# Patient Record
Sex: Female | Born: 1945 | Race: White | Hispanic: No | Smoking: Former smoker
Health system: Southern US, Community
[De-identification: ages and names within clinical notes are randomized; demographics above are authoritative.]

## PROBLEM LIST (undated history)

## (undated) DIAGNOSIS — K08109 Complete loss of teeth, unspecified cause, unspecified class: Secondary | ICD-10-CM

## (undated) DIAGNOSIS — Z91410 Personal history of adult physical and sexual abuse: Secondary | ICD-10-CM

## (undated) DIAGNOSIS — K219 Gastro-esophageal reflux disease without esophagitis: Secondary | ICD-10-CM

## (undated) DIAGNOSIS — I10 Essential (primary) hypertension: Secondary | ICD-10-CM

## (undated) DIAGNOSIS — M25551 Pain in right hip: Secondary | ICD-10-CM

## (undated) DIAGNOSIS — M1990 Unspecified osteoarthritis, unspecified site: Secondary | ICD-10-CM

## (undated) DIAGNOSIS — Z972 Presence of dental prosthetic device (complete) (partial): Secondary | ICD-10-CM

## (undated) DIAGNOSIS — I739 Peripheral vascular disease, unspecified: Secondary | ICD-10-CM

## (undated) DIAGNOSIS — K589 Irritable bowel syndrome without diarrhea: Secondary | ICD-10-CM

## (undated) DIAGNOSIS — E119 Type 2 diabetes mellitus without complications: Secondary | ICD-10-CM

## (undated) DIAGNOSIS — G7000 Myasthenia gravis without (acute) exacerbation: Secondary | ICD-10-CM

## (undated) DIAGNOSIS — G8929 Other chronic pain: Secondary | ICD-10-CM

## (undated) DIAGNOSIS — M797 Fibromyalgia: Secondary | ICD-10-CM

## (undated) DIAGNOSIS — G47419 Narcolepsy without cataplexy: Secondary | ICD-10-CM

## (undated) DIAGNOSIS — Z9889 Other specified postprocedural states: Secondary | ICD-10-CM

## (undated) DIAGNOSIS — M545 Low back pain, unspecified: Secondary | ICD-10-CM

## (undated) DIAGNOSIS — N1832 Chronic kidney disease, stage 3b: Secondary | ICD-10-CM

## (undated) DIAGNOSIS — J45909 Unspecified asthma, uncomplicated: Secondary | ICD-10-CM

## (undated) HISTORY — PX: PARTIAL HYSTERECTOMY: SHX80

## (undated) HISTORY — DX: Presence of dental prosthetic device (complete) (partial): K08.109

## (undated) HISTORY — PX: CATARACT EXTRACTION, BILATERAL: SHX1313

---

## 2017-05-31 ENCOUNTER — Other Ambulatory Visit: Payer: Self-pay | Admitting: Internal Medicine

## 2017-05-31 DIAGNOSIS — Z1231 Encounter for screening mammogram for malignant neoplasm of breast: Secondary | ICD-10-CM

## 2017-05-31 DIAGNOSIS — N958 Other specified menopausal and perimenopausal disorders: Secondary | ICD-10-CM

## 2017-06-05 ENCOUNTER — Other Ambulatory Visit: Payer: Self-pay

## 2017-06-05 ENCOUNTER — Telehealth: Payer: Self-pay

## 2017-06-05 DIAGNOSIS — Z1211 Encounter for screening for malignant neoplasm of colon: Secondary | ICD-10-CM

## 2017-06-05 NOTE — Telephone Encounter (Signed)
Gastroenterology Pre-Procedure Review  Request Date: 07/03/17 Requesting Physician: Dr. Allen Norris  PATIENT REVIEW QUESTIONS: The patient responded to the following health history questions as indicated:    1. Are you having any GI issues? no 2. Do you have a personal history of Polyps? yes (10 years ago) 3. Do you have a family history of Colon Cancer or Polyps? no 4. Diabetes Mellitus? yes (yes type 2) 5. Joint replacements in the past 12 months?no 6. Major health problems in the past 3 months?no 7. Any artificial heart valves, MVP, or defibrillator?no    MEDICATIONS & ALLERGIES:    Patient reports the following regarding taking any anticoagulation/antiplatelet therapy:   Plavix, Coumadin, Eliquis, Xarelto, Lovenox, Pradaxa, Brilinta, or Effient? no Aspirin? yes (81 mg daily)  Patient confirms/reports the following medications:  No current outpatient medications on file.   No current facility-administered medications for this visit.     Patient confirms/reports the following allergies:  Allergies not on file  No orders of the defined types were placed in this encounter.   AUTHORIZATION INFORMATION Primary Insurance: 1D#: Group #:  Secondary Insurance: 1D#: Group #:  SCHEDULE INFORMATION: Date: 07/03/17 Time: Location:[facility]

## 2017-06-28 ENCOUNTER — Encounter: Payer: Self-pay | Admitting: *Deleted

## 2017-06-28 ENCOUNTER — Other Ambulatory Visit: Payer: Self-pay

## 2017-06-29 NOTE — Discharge Instructions (Signed)
General Anesthesia, Adult, Care After °These instructions provide you with information about caring for yourself after your procedure. Your health care provider may also give you more specific instructions. Your treatment has been planned according to current medical practices, but problems sometimes occur. Call your health care provider if you have any problems or questions after your procedure. °What can I expect after the procedure? °After the procedure, it is common to have: °· Vomiting. °· A sore throat. °· Mental slowness. ° °It is common to feel: °· Nauseous. °· Cold or shivery. °· Sleepy. °· Tired. °· Sore or achy, even in parts of your body where you did not have surgery. ° °Follow these instructions at home: °For at least 24 hours after the procedure: °· Do not: °? Participate in activities where you could fall or become injured. °? Drive. °? Use heavy machinery. °? Drink alcohol. °? Take sleeping pills or medicines that cause drowsiness. °? Make important decisions or sign legal documents. °? Take care of children on your own. °· Rest. °Eating and drinking °· If you vomit, drink water, juice, or soup when you can drink without vomiting. °· Drink enough fluid to keep your urine clear or pale yellow. °· Make sure you have little or no nausea before eating solid foods. °· Follow the diet recommended by your health care provider. °General instructions °· Have a responsible adult stay with you until you are awake and alert. °· Return to your normal activities as told by your health care provider. Ask your health care provider what activities are safe for you. °· Take over-the-counter and prescription medicines only as told by your health care provider. °· If you smoke, do not smoke without supervision. °· Keep all follow-up visits as told by your health care provider. This is important. °Contact a health care provider if: °· You continue to have nausea or vomiting at home, and medicines are not helpful. °· You  cannot drink fluids or start eating again. °· You cannot urinate after 8-12 hours. °· You develop a skin rash. °· You have fever. °· You have increasing redness at the site of your procedure. °Get help right away if: °· You have difficulty breathing. °· You have chest pain. °· You have unexpected bleeding. °· You feel that you are having a life-threatening or urgent problem. °This information is not intended to replace advice given to you by your health care provider. Make sure you discuss any questions you have with your health care provider. °Document Released: 08/08/2000 Document Revised: 10/05/2015 Document Reviewed: 04/16/2015 °Elsevier Interactive Patient Education © 2018 [employer] ° °

## 2017-07-03 ENCOUNTER — Encounter
Admission: RE | Disposition: A | Discharge status: Home / Self Care | Payer: Self-pay | Source: Ambulatory Visit | Attending: Gastroenterology

## 2017-07-03 ENCOUNTER — Ambulatory Visit
Admission: RE | Admit: 2017-07-03 | Discharge: 2017-07-03 | Disposition: A | Discharge status: Home / Self Care | Payer: Medicare Other | Source: Ambulatory Visit | Attending: Gastroenterology | Admitting: Gastroenterology

## 2017-07-03 ENCOUNTER — Ambulatory Visit: Payer: Medicare Other | Admitting: Anesthesiology

## 2017-07-03 DIAGNOSIS — Z79899 Other long term (current) drug therapy: Secondary | ICD-10-CM | POA: Insufficient documentation

## 2017-07-03 DIAGNOSIS — Z87891 Personal history of nicotine dependence: Secondary | ICD-10-CM | POA: Diagnosis not present

## 2017-07-03 DIAGNOSIS — Z7982 Long term (current) use of aspirin: Secondary | ICD-10-CM | POA: Diagnosis not present

## 2017-07-03 DIAGNOSIS — K648 Other hemorrhoids: Secondary | ICD-10-CM | POA: Insufficient documentation

## 2017-07-03 DIAGNOSIS — I10 Essential (primary) hypertension: Secondary | ICD-10-CM | POA: Insufficient documentation

## 2017-07-03 DIAGNOSIS — M797 Fibromyalgia: Secondary | ICD-10-CM | POA: Insufficient documentation

## 2017-07-03 DIAGNOSIS — K219 Gastro-esophageal reflux disease without esophagitis: Secondary | ICD-10-CM | POA: Insufficient documentation

## 2017-07-03 DIAGNOSIS — D125 Benign neoplasm of sigmoid colon: Secondary | ICD-10-CM | POA: Diagnosis not present

## 2017-07-03 DIAGNOSIS — K635 Polyp of colon: Secondary | ICD-10-CM

## 2017-07-03 DIAGNOSIS — Z1211 Encounter for screening for malignant neoplasm of colon: Secondary | ICD-10-CM | POA: Diagnosis not present

## 2017-07-03 DIAGNOSIS — E119 Type 2 diabetes mellitus without complications: Secondary | ICD-10-CM | POA: Diagnosis not present

## 2017-07-03 DIAGNOSIS — Z7984 Long term (current) use of oral hypoglycemic drugs: Secondary | ICD-10-CM | POA: Insufficient documentation

## 2017-07-03 HISTORY — DX: Other specified postprocedural states: Z98.890

## 2017-07-03 HISTORY — PX: POLYPECTOMY: SHX5525

## 2017-07-03 HISTORY — DX: Presence of dental prosthetic device (complete) (partial): Z97.2

## 2017-07-03 HISTORY — DX: Type 2 diabetes mellitus without complications: E11.9

## 2017-07-03 HISTORY — DX: Gastro-esophageal reflux disease without esophagitis: K21.9

## 2017-07-03 HISTORY — DX: Personal history of adult physical and sexual abuse: Z91.410

## 2017-07-03 HISTORY — DX: Low back pain, unspecified: M54.50

## 2017-07-03 HISTORY — PX: COLONOSCOPY WITH PROPOFOL: SHX5780

## 2017-07-03 HISTORY — DX: Unspecified osteoarthritis, unspecified site: M19.90

## 2017-07-03 HISTORY — DX: Essential (primary) hypertension: I10

## 2017-07-03 HISTORY — DX: Pain in right hip: M25.551

## 2017-07-03 HISTORY — DX: Low back pain: M54.5

## 2017-07-03 HISTORY — DX: Other chronic pain: G89.29

## 2017-07-03 HISTORY — DX: Fibromyalgia: M79.7

## 2017-07-03 LAB — GLUCOSE, CAPILLARY
Glucose-Capillary: 135 mg/dL — ABNORMAL HIGH (ref 65–99)
Glucose-Capillary: 125 mg/dL — ABNORMAL HIGH (ref 65–99)

## 2017-07-03 SURGERY — COLONOSCOPY WITH PROPOFOL
Anesthesia: General | Site: Rectum | Wound class: Contaminated

## 2017-07-03 MED ORDER — LIDOCAINE HCL (CARDIAC) 20 MG/ML IV SOLN
INTRAVENOUS | Status: DC | PRN
  Administered 2017-07-03: 40 mg via INTRAVENOUS

## 2017-07-03 MED ORDER — ACETAMINOPHEN 160 MG/5ML PO SOLN: 325.0000 mg | ORAL | Status: DC | PRN

## 2017-07-03 MED ORDER — STERILE WATER FOR IRRIGATION IR SOLN
Status: DC | PRN
  Administered 2017-07-03: .5 mL

## 2017-07-03 MED ORDER — ONDANSETRON HCL 4 MG/2ML IJ SOLN: 4.0000 mg | Freq: Once | INTRAMUSCULAR | Status: DC | PRN

## 2017-07-03 MED ORDER — LACTATED RINGERS IV SOLN
INTRAVENOUS | Status: DC
  Administered 2017-07-03: 07:00:00 via INTRAVENOUS

## 2017-07-03 MED ORDER — PROPOFOL 10 MG/ML IV BOLUS
INTRAVENOUS | Status: DC | PRN
  Administered 2017-07-03: 30 mg via INTRAVENOUS
  Administered 2017-07-03 (×2): 20 mg via INTRAVENOUS
  Administered 2017-07-03: 30 mg via INTRAVENOUS
  Administered 2017-07-03: 100 mg via INTRAVENOUS
  Administered 2017-07-03: 30 mg via INTRAVENOUS
  Administered 2017-07-03: 20 mg via INTRAVENOUS

## 2017-07-03 MED ORDER — ACETAMINOPHEN 325 MG PO TABS: 650.0000 mg | ORAL_TABLET | Freq: Once | ORAL | Status: DC | PRN

## 2017-07-03 SURGICAL SUPPLY — 6 items
CANISTER SUCT 1200ML W/VALVE (MISCELLANEOUS) ×4 IMPLANT
FORCEPS BIOP RAD 4 LRG CAP 4 (CUTTING FORCEPS) ×4 IMPLANT
GOWN CVR UNV OPN BCK APRN NK (MISCELLANEOUS) ×4 IMPLANT
GOWN ISOL THUMB LOOP REG UNIV (MISCELLANEOUS) ×4
KIT ENDO PROCEDURE OLY (KITS) ×4 IMPLANT
WATER STERILE IRR 250ML POUR (IV SOLUTION) ×4 IMPLANT

## 2017-07-03 NOTE — Anesthesia Preprocedure Evaluation (Signed)
Anesthesia Evaluation  Patient identified by MRN, date of birth, ID band Patient awake    Reviewed: Allergy & Precautions, NPO status , Patient's Chart, lab work & pertinent test results  History of Anesthesia Complications Negative for: history of anesthetic complications  Airway Mallampati: III  TM Distance: >3 FB Neck ROM: Full    Dental  (+) Edentulous Upper, Edentulous Lower, Upper Dentures, Lower Dentures   Pulmonary former smoker (quit 2006),  Snoring    Pulmonary exam normal breath sounds clear to auscultation       Cardiovascular Exercise Tolerance: Good hypertension,  Rhythm:Regular Rate:Normal + Systolic murmurs    Neuro/Psych Fibromyalgia     GI/Hepatic GERD  ,  Endo/Other  diabetes  Renal/GU negative Renal ROS     Musculoskeletal  (+) Fibromyalgia -  Abdominal   Peds  Hematology negative hematology ROS (+)   Anesthesia Other Findings   Reproductive/Obstetrics                             Anesthesia Physical Anesthesia Plan  ASA: III  Anesthesia Plan: General   Post-op Pain Management:    Induction: Intravenous  PONV Risk Score and Plan: 2 and Propofol infusion and TIVA  Airway Management Planned: Natural Airway  Additional Equipment:   Intra-op Plan:   Post-operative Plan:   Informed Consent: I have reviewed the patients History and Physical, chart, labs and discussed the procedure including the risks, benefits and alternatives for the proposed anesthesia with the patient or authorized representative who has indicated his/her understanding and acceptance.     Plan Discussed with: CRNA  Anesthesia Plan Comments:         Anesthesia Quick Evaluation
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
## 2017-07-03 NOTE — Anesthesia Procedure Notes (Signed)
Procedure Name: MAC Date/Time: 07/03/2017 7:51 AM Performed by: Janna Arch, CRNA Pre-anesthesia Checklist: Patient identified, Emergency Drugs available, Suction available and Patient being monitored Patient Re-evaluated:Patient Re-evaluated prior to induction Oxygen Delivery Method: Nasal cannula

## 2017-07-03 NOTE — Transfer of Care (Signed)
Immediate Anesthesia Transfer of Care Note  Patient: Jennifer Shelton  Procedure(s) Performed: COLONOSCOPY WITH PROPOFOL (N/A Rectum) POLYPECTOMY (Rectum)  Patient Location: PACU  Anesthesia Type: General  Level of Consciousness: awake, alert  and patient cooperative  Airway and Oxygen Therapy: Patient Spontanous Breathing and Patient connected to supplemental oxygen  Post-op Assessment: Post-op Vital signs reviewed, Patient's Cardiovascular Status Stable, Respiratory Function Stable, Patent Airway and No signs of Nausea or vomiting  Post-op Vital Signs: Reviewed and stable  Complications: No apparent anesthesia complications

## 2017-07-03 NOTE — Op Note (Signed)
[facility] Gastroenterology Patient Name: Jennifer Shelton Procedure Date: 07/03/2017 7:37 AM MRN: [id number] Account #: [id number] Date of Birth: 1945/10/06 Admit Type: Outpatient Age: 72 Room: [facility] OR ROOM 01 Gender: Female Note Status: Finalized Procedure:            Colonoscopy Indications:          Screening for colorectal malignant neoplasm Providers:            [name] MD, MD Medicines:            Propofol per Anesthesia Complications:        No immediate complications. Procedure:            Pre-Anesthesia Assessment:                       - Prior to the procedure, a History and Physical was                        performed, and patient medications and allergies were                        reviewed. The patient's tolerance of previous                        anesthesia was also reviewed. The risks and benefits of                        the procedure and the sedation options and risks were                        discussed with the patient. All questions were                        answered, and informed consent was obtained. Prior                        Anticoagulants: The patient has taken no previous                        anticoagulant or antiplatelet agents. ASA Grade                        Assessment: II - A patient with mild systemic disease.                        After reviewing the risks and benefits, the patient was                        deemed in satisfactory condition to undergo the                        procedure.                       After obtaining informed consent, the colonoscope was                        passed under direct vision. Throughout the procedure,                        the patient's blood pressure,  pulse, and oxygen                        saturations were monitored continuously. The Olympus                        CF-HQ190L Colonoscope (S#. [id number]) was introduced                        through the anus and advanced to the  the cecum,                        identified by appendiceal orifice and ileocecal valve.                        The colonoscopy was performed without difficulty. The                        patient tolerated the procedure well. The quality of                        the bowel preparation was good. Findings:      The perianal and digital rectal examinations were normal.      Two sessile polyps were found in the sigmoid colon. The polyps were 2 to       3 mm in size. These polyps were removed with a cold biopsy forceps.       Resection and retrieval were complete.      Non-bleeding internal hemorrhoids were found during retroflexion. The       hemorrhoids were Grade I (internal hemorrhoids that do not prolapse). Impression:           - Two 2 to 3 mm polyps in the sigmoid colon, removed                        with a cold biopsy forceps. Resected and retrieved.                       - Non-bleeding internal hemorrhoids. Recommendation:       - Discharge patient to home.                       - Resume previous diet.                       - Continue present medications.                       - Await pathology results.                       - Repeat colonoscopy in 5 years if polyp adenoma and 10                        years if hyperplastic Procedure Code(s):    --- Professional ---                       [phone], Colonoscopy, flexible; with biopsy, single or                        multiple Diagnosis Code(s):    ---  Professional ---                       Z12.11, Encounter for screening for malignant neoplasm                        of colon                       D12.5, Benign neoplasm of sigmoid colon CPT copyright 2016 American Medical Association. All rights reserved. The codes documented in this report are preliminary and upon coder review may  be revised to meet current compliance requirements. [name] MD, MD 07/03/2017 8:09:09 AM This report has been signed electronically. Number of  Addenda: 0 Note Initiated On: 07/03/2017 7:37 AM Scope Withdrawal Time: 0 hours 7 minutes 17 seconds  Total Procedure Duration: 0 hours 10 minutes 54 seconds       [facility]

## 2017-07-03 NOTE — Anesthesia Postprocedure Evaluation (Signed)
Anesthesia Post Note  Patient: Jennifer Shelton  Procedure(s) Performed: COLONOSCOPY WITH PROPOFOL (N/A Rectum) POLYPECTOMY (Rectum)  Patient location during evaluation: PACU Anesthesia Type: General Level of consciousness: awake and alert, oriented and patient cooperative Pain management: pain level controlled Vital Signs Assessment: post-procedure vital signs reviewed and stable Respiratory status: spontaneous breathing, nonlabored ventilation and respiratory function stable Cardiovascular status: blood pressure returned to baseline and stable Postop Assessment: adequate PO intake Anesthetic complications: no    Darrin Nipper

## 2017-07-10 ENCOUNTER — Encounter: Payer: Self-pay | Admitting: Gastroenterology

## 2018-01-22 ENCOUNTER — Ambulatory Visit: Payer: Medicare Other | Admitting: Gastroenterology

## 2018-03-07 ENCOUNTER — Ambulatory Visit: Payer: Medicare Other | Admitting: Gastroenterology

## 2018-04-11 ENCOUNTER — Ambulatory Visit: Payer: Medicare Other | Admitting: Gastroenterology

## 2018-05-15 ENCOUNTER — Encounter: Payer: Self-pay | Admitting: *Deleted

## 2018-05-18 ENCOUNTER — Ambulatory Visit
Admission: EM | Admit: 2018-05-18 | Discharge: 2018-05-18 | Disposition: A | Discharge status: Home / Self Care | Payer: Medicare Other | Attending: Family Medicine | Admitting: Family Medicine

## 2018-05-18 ENCOUNTER — Other Ambulatory Visit: Payer: Self-pay

## 2018-05-18 DIAGNOSIS — J0100 Acute maxillary sinusitis, unspecified: Secondary | ICD-10-CM | POA: Insufficient documentation

## 2018-05-18 LAB — RAPID STREP SCREEN (MED CTR MEBANE ONLY): STREPTOCOCCUS, GROUP A SCREEN (DIRECT): NEGATIVE

## 2018-05-18 LAB — RAPID INFLUENZA A&B ANTIGENS
Influenza A (ARMC): NEGATIVE
Influenza B (ARMC): NEGATIVE

## 2018-05-18 MED ORDER — AMOXICILLIN-POT CLAVULANATE 875-125 MG PO TABS: 1.0000 | ORAL_TABLET | Freq: Two times a day (BID) | ORAL | 0 refills | Status: AC

## 2018-05-18 MED ORDER — FLUTICASONE PROPIONATE 50 MCG/ACT NA SUSP: 2.0000 | Freq: Every day | NASAL | 0 refills | Status: DC
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
## 2018-05-18 NOTE — Discharge Instructions (Signed)
-  Take medications as directed. -Flonase daily -OTC Zyrtec daily at this time. -Follow-up if no improvement.

## 2018-05-18 NOTE — ED Triage Notes (Signed)
Started 2 days ago. Endorses fever up to 101. Positive for body aches and chills.

## 2018-05-21 ENCOUNTER — Ambulatory Visit: Payer: Medicare Other | Admitting: Gastroenterology

## 2018-05-21 LAB — CULTURE, GROUP A STREP (THRC)

## 2019-02-26 ENCOUNTER — Ambulatory Visit
Admission: EM | Admit: 2019-02-26 | Discharge: 2019-02-26 | Disposition: A | Discharge status: Home / Self Care | Payer: Medicare Other | Attending: Family Medicine | Admitting: Family Medicine

## 2019-02-26 ENCOUNTER — Other Ambulatory Visit: Payer: Self-pay

## 2019-02-26 ENCOUNTER — Ambulatory Visit (INDEPENDENT_AMBULATORY_CARE_PROVIDER_SITE_OTHER): Payer: Medicare Other

## 2019-02-26 ENCOUNTER — Encounter: Payer: Self-pay | Admitting: Emergency Medicine

## 2019-02-26 DIAGNOSIS — R05 Cough: Secondary | ICD-10-CM | POA: Diagnosis not present

## 2019-02-26 DIAGNOSIS — Z20828 Contact with and (suspected) exposure to other viral communicable diseases: Secondary | ICD-10-CM

## 2019-02-26 DIAGNOSIS — Z20822 Contact with and (suspected) exposure to covid-19: Secondary | ICD-10-CM

## 2019-02-26 DIAGNOSIS — J069 Acute upper respiratory infection, unspecified: Secondary | ICD-10-CM

## 2019-02-26 DIAGNOSIS — R0602 Shortness of breath: Secondary | ICD-10-CM | POA: Diagnosis not present

## 2019-02-26 DIAGNOSIS — Z7189 Other specified counseling: Secondary | ICD-10-CM | POA: Diagnosis not present

## 2019-02-26 IMAGING — CR DG CHEST 2V
3 series · 3 of 3 positions shown · non-contrast
Comparison: None.

CLINICAL DATA: Non smoker. No hx of cancer. Pt c/o cough, shortness
of breath and headache. Started yesterday but worse this morning.
Denies fever, body achescough and SOB

EXAM:
CHEST - 2 VIEW

[chest pa]
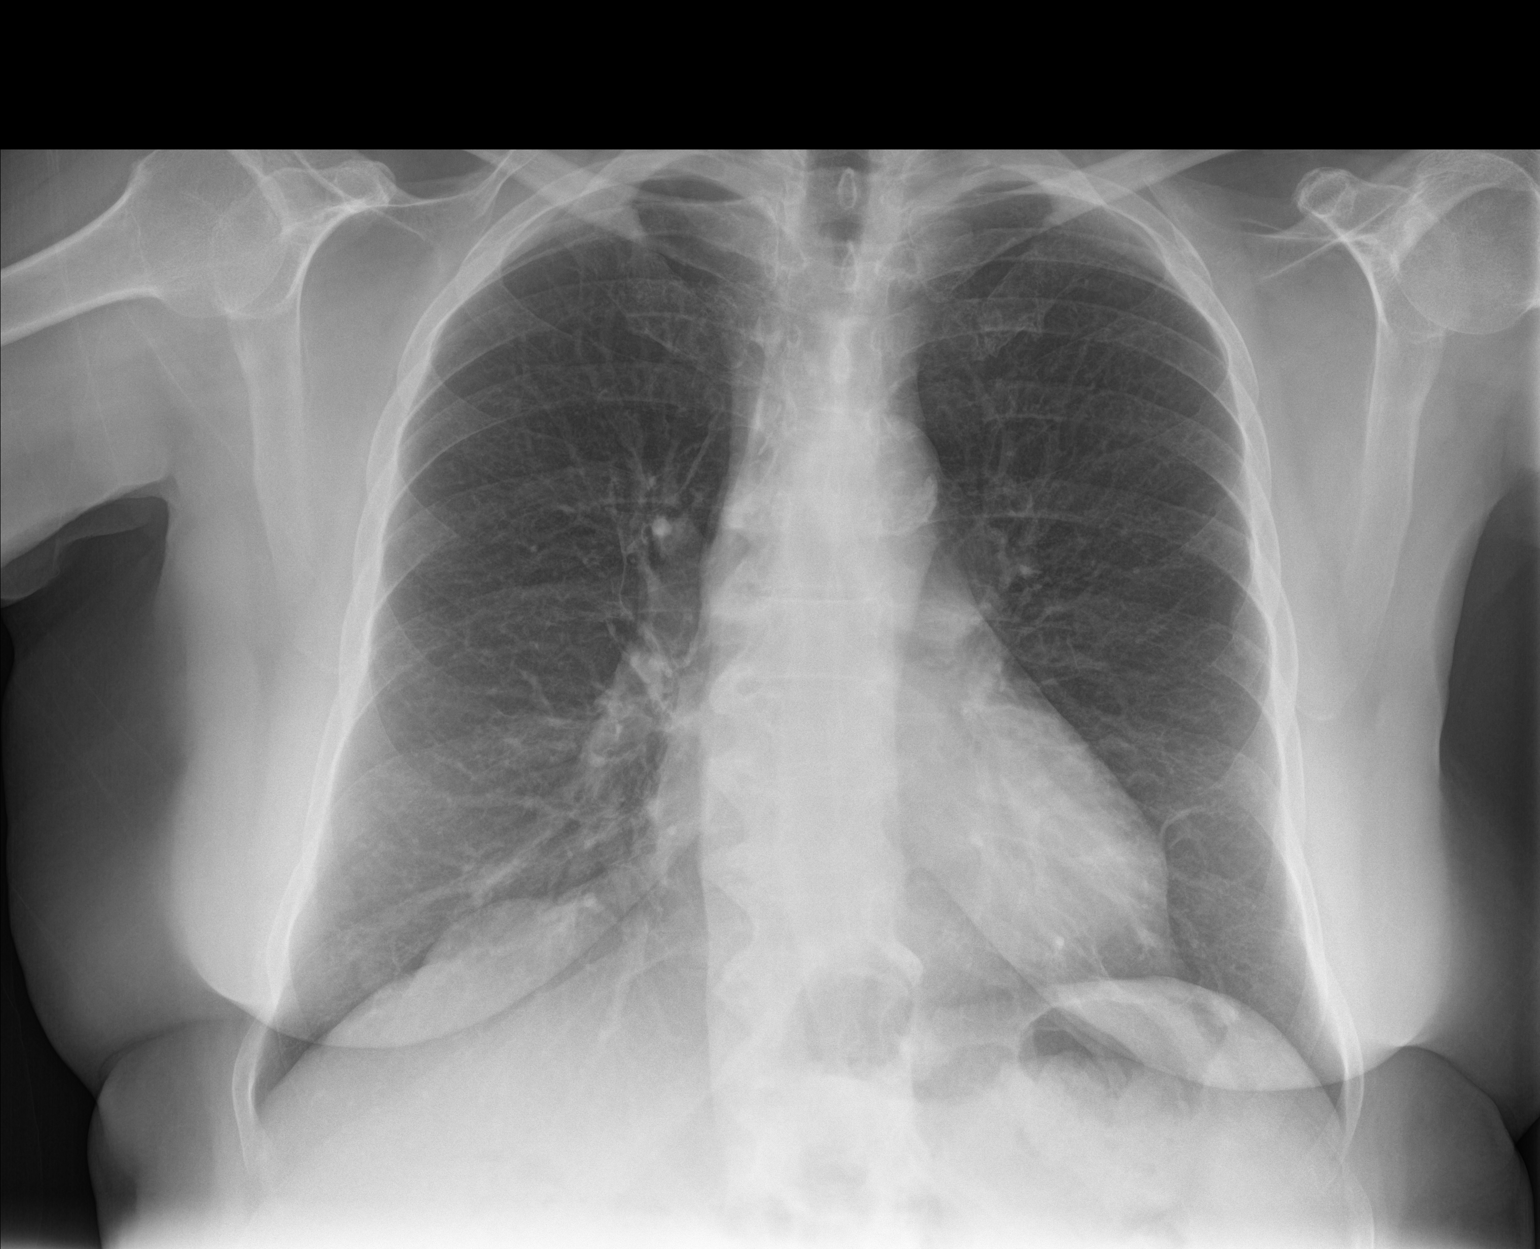

[chest lat (1 of 2)]
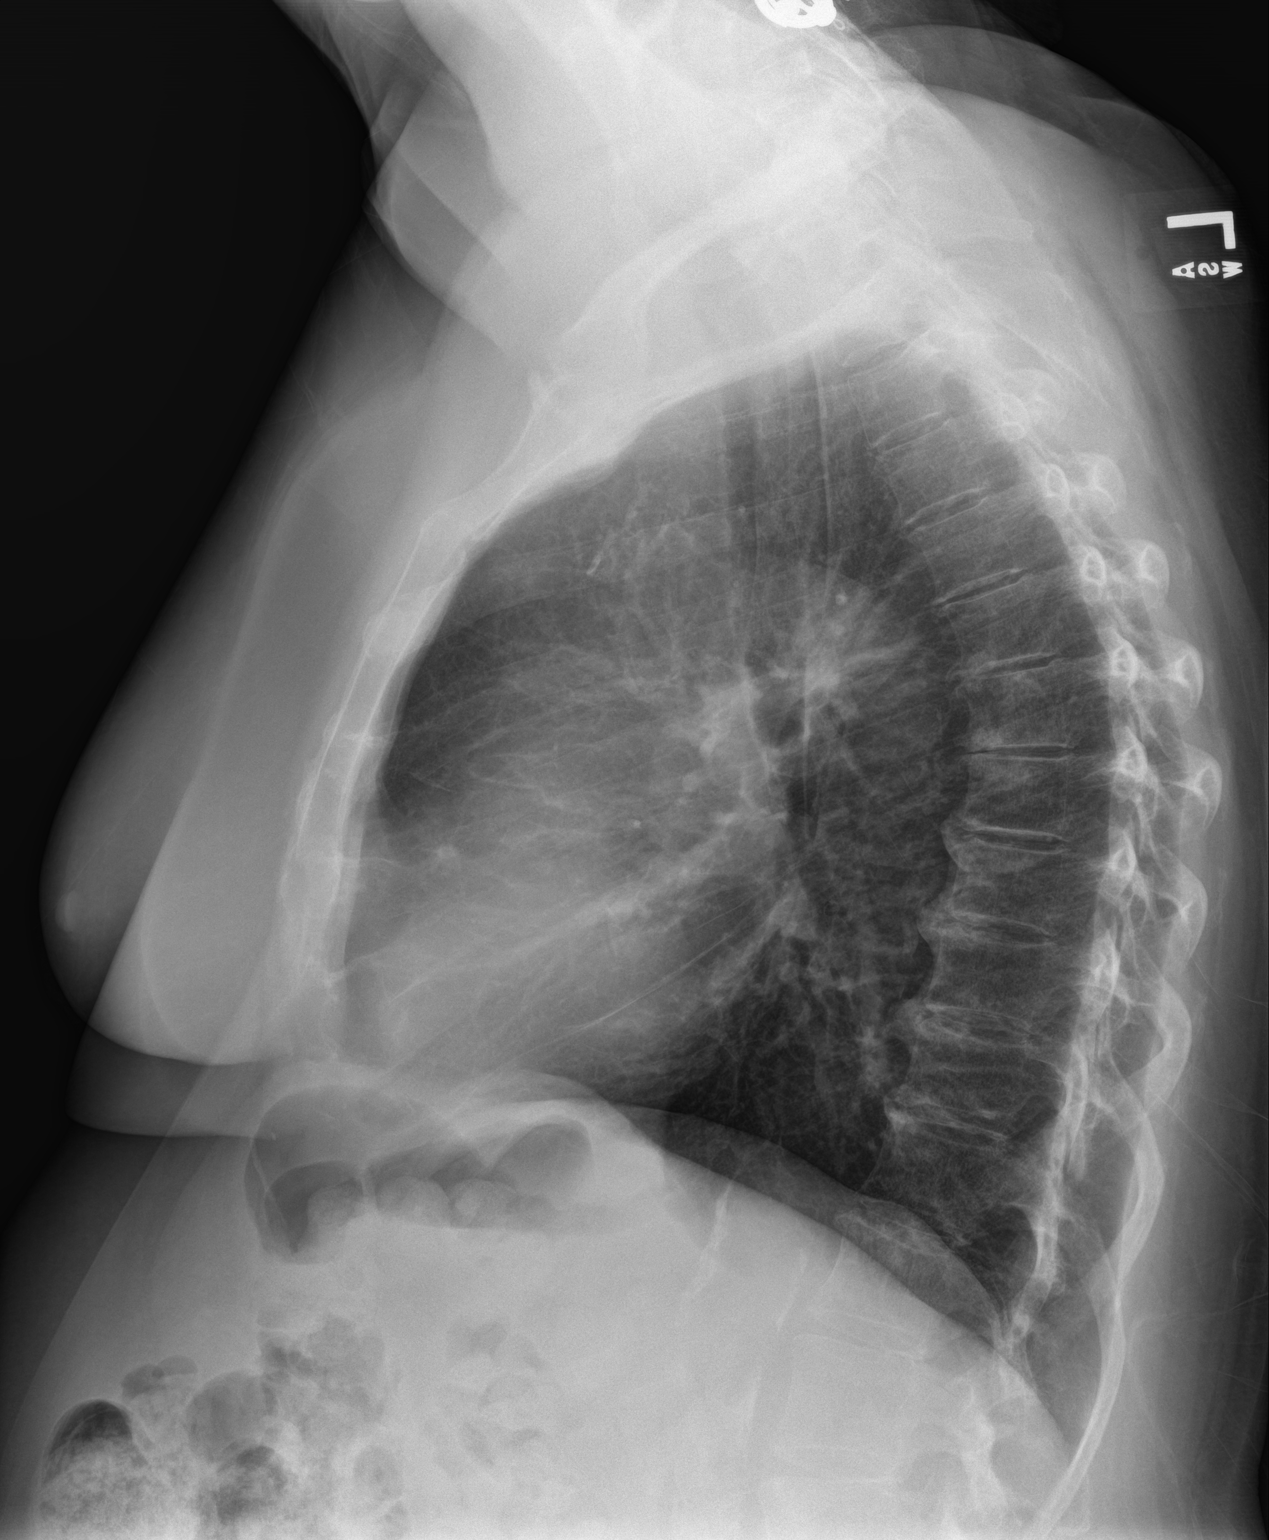

[chest lat (2 of 2)]
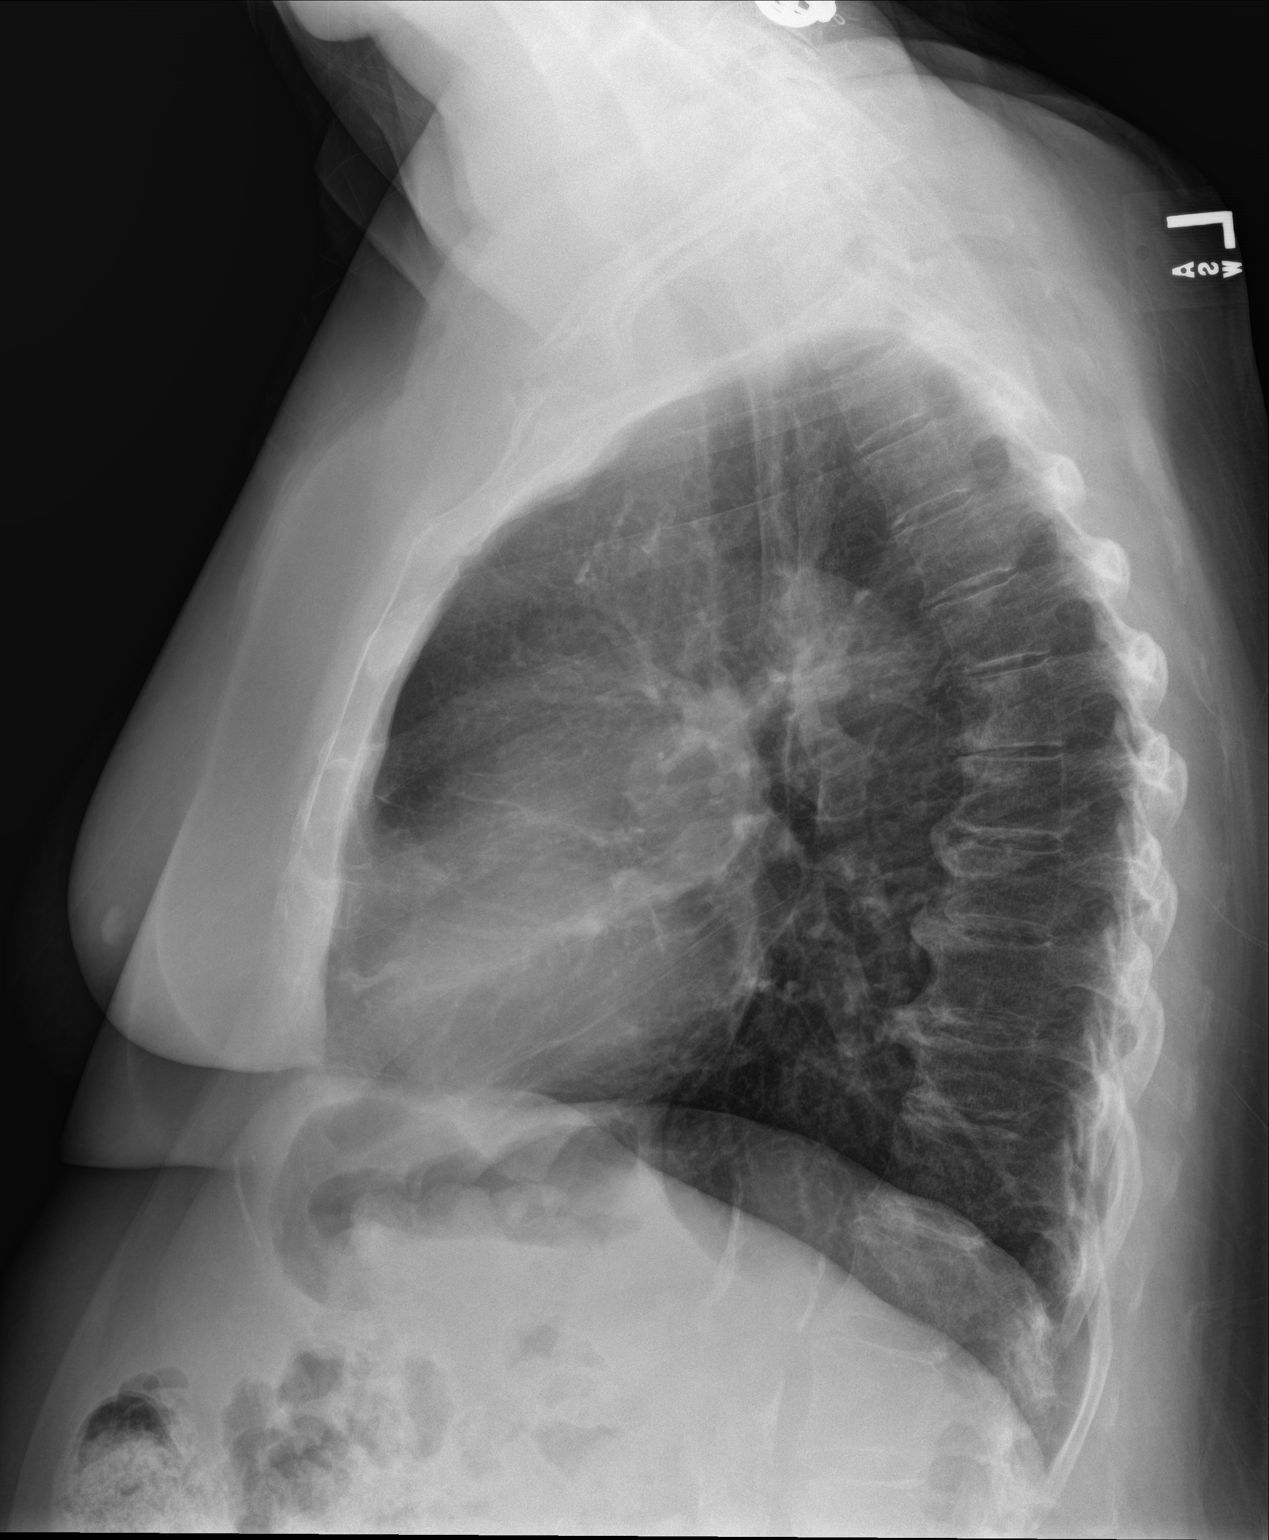

[3 of 3 positions shown; findings below may reference images not displayed]

FINDINGS: Normal mediastinum and cardiac silhouette. Normal pulmonary
vasculature. No evidence of effusion, infiltrate, or pneumothorax.
No acute bony abnormality. Degenerative osteophytosis of the spine.
IMPRESSION: No acute cardiopulmonary process.

## 2019-02-26 MED ORDER — ALBUTEROL SULFATE HFA 108 (90 BASE) MCG/ACT IN AERS: 1.0000 | INHALATION_SPRAY | Freq: Four times a day (QID) | RESPIRATORY_TRACT | 0 refills | Status: AC | PRN

## 2019-02-26 MED ORDER — HYDROCOD POLST-CPM POLST ER 10-8 MG/5ML PO SUER: 5.0000 mL | Freq: Two times a day (BID) | ORAL | 0 refills | Status: DC | PRN
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
## 2019-02-26 NOTE — Discharge Instructions (Addendum)
It was very nice seeing you today in clinic. Thank you for entrusting me with your care.   Rest and increase hydration. Use cough medication and inhaler to help with cough/shortness  of breath/wheezing.   Make arrangements to follow up with your regular doctor in 1 week for re-evaluation if not improving. If your symptoms/condition worsens, please seek follow up care either here or in the ER. Please remember, our [place] providers are "right here with you" when you need [place].   Again, it was my pleasure to take care of you today. Thank you for choosing our clinic. I hope that you start to feel better quickly.   Honor Loh, MSN, APRN, FNP-C, CEN Advanced Practice Provider [facility]

## 2019-02-26 NOTE — ED Triage Notes (Signed)
Pt c/o cough, shortness of breath and headache. Started yesterday but worse this morning. Denies fever, body aches.

## 2019-02-27 LAB — NOVEL CORONAVIRUS, NAA (HOSP ORDER, SEND-OUT TO REF LAB; TAT 18-24 HRS): SARS-CoV-2, NAA: NOT DETECTED

## 2019-04-17 ENCOUNTER — Other Ambulatory Visit: Payer: Self-pay | Admitting: Internal Medicine

## 2019-04-17 DIAGNOSIS — Z1231 Encounter for screening mammogram for malignant neoplasm of breast: Secondary | ICD-10-CM

## 2019-07-09 DIAGNOSIS — E1169 Type 2 diabetes mellitus with other specified complication: Secondary | ICD-10-CM | POA: Diagnosis not present

## 2019-07-09 DIAGNOSIS — E785 Hyperlipidemia, unspecified: Secondary | ICD-10-CM | POA: Diagnosis not present

## 2019-07-15 DIAGNOSIS — H02401 Unspecified ptosis of right eyelid: Secondary | ICD-10-CM | POA: Diagnosis not present

## 2019-07-15 DIAGNOSIS — Z79899 Other long term (current) drug therapy: Secondary | ICD-10-CM | POA: Diagnosis not present

## 2019-07-15 DIAGNOSIS — I10 Essential (primary) hypertension: Secondary | ICD-10-CM | POA: Diagnosis not present

## 2019-07-15 DIAGNOSIS — E119 Type 2 diabetes mellitus without complications: Secondary | ICD-10-CM | POA: Diagnosis not present

## 2019-07-15 DIAGNOSIS — Z87891 Personal history of nicotine dependence: Secondary | ICD-10-CM | POA: Diagnosis not present

## 2019-07-19 ENCOUNTER — Other Ambulatory Visit: Payer: Self-pay

## 2019-07-19 ENCOUNTER — Other Ambulatory Visit: Payer: Self-pay | Admitting: Internal Medicine

## 2019-07-19 ENCOUNTER — Inpatient Hospital Stay
Admission: EM | Admit: 2019-07-19 | Discharge: 2019-07-21 | DRG: 068 | Disposition: A | Discharge status: Home / Self Care | Payer: Medicare HMO | Source: Ambulatory Visit | Attending: Internal Medicine | Admitting: Internal Medicine

## 2019-07-19 ENCOUNTER — Ambulatory Visit
Admission: RE | Admit: 2019-07-19 | Discharge: 2019-07-19 | Disposition: A | Discharge status: Home / Self Care | Payer: Medicare HMO | Source: Ambulatory Visit | Attending: Internal Medicine | Admitting: Internal Medicine

## 2019-07-19 ENCOUNTER — Encounter: Payer: Self-pay | Admitting: Emergency Medicine

## 2019-07-19 ENCOUNTER — Emergency Department: Payer: Medicare HMO

## 2019-07-19 DIAGNOSIS — K219 Gastro-esophageal reflux disease without esophagitis: Secondary | ICD-10-CM | POA: Diagnosis present

## 2019-07-19 DIAGNOSIS — G8929 Other chronic pain: Secondary | ICD-10-CM | POA: Diagnosis present

## 2019-07-19 DIAGNOSIS — H539 Unspecified visual disturbance: Secondary | ICD-10-CM

## 2019-07-19 DIAGNOSIS — M545 Low back pain: Secondary | ICD-10-CM | POA: Diagnosis present

## 2019-07-19 DIAGNOSIS — M25551 Pain in right hip: Secondary | ICD-10-CM | POA: Diagnosis present

## 2019-07-19 DIAGNOSIS — M19041 Primary osteoarthritis, right hand: Secondary | ICD-10-CM | POA: Diagnosis present

## 2019-07-19 DIAGNOSIS — W010XXA Fall on same level from slipping, tripping and stumbling without subsequent striking against object, initial encounter: Secondary | ICD-10-CM | POA: Diagnosis present

## 2019-07-19 DIAGNOSIS — H4901 Third [oculomotor] nerve palsy, right eye: Secondary | ICD-10-CM | POA: Diagnosis not present

## 2019-07-19 DIAGNOSIS — Z794 Long term (current) use of insulin: Secondary | ICD-10-CM

## 2019-07-19 DIAGNOSIS — Z87891 Personal history of nicotine dependence: Secondary | ICD-10-CM

## 2019-07-19 DIAGNOSIS — Y92009 Unspecified place in unspecified non-institutional (private) residence as the place of occurrence of the external cause: Secondary | ICD-10-CM

## 2019-07-19 DIAGNOSIS — H02401 Unspecified ptosis of right eyelid: Secondary | ICD-10-CM | POA: Diagnosis not present

## 2019-07-19 DIAGNOSIS — Z888 Allergy status to other drugs, medicaments and biological substances status: Secondary | ICD-10-CM | POA: Diagnosis not present

## 2019-07-19 DIAGNOSIS — E871 Hypo-osmolality and hyponatremia: Secondary | ICD-10-CM | POA: Diagnosis not present

## 2019-07-19 DIAGNOSIS — I10 Essential (primary) hypertension: Secondary | ICD-10-CM | POA: Diagnosis not present

## 2019-07-19 DIAGNOSIS — Z90711 Acquired absence of uterus with remaining cervical stump: Secondary | ICD-10-CM

## 2019-07-19 DIAGNOSIS — S0990XA Unspecified injury of head, initial encounter: Secondary | ICD-10-CM | POA: Insufficient documentation

## 2019-07-19 DIAGNOSIS — F419 Anxiety disorder, unspecified: Secondary | ICD-10-CM | POA: Diagnosis not present

## 2019-07-19 DIAGNOSIS — Z20822 Contact with and (suspected) exposure to covid-19: Secondary | ICD-10-CM | POA: Diagnosis not present

## 2019-07-19 DIAGNOSIS — E11649 Type 2 diabetes mellitus with hypoglycemia without coma: Secondary | ICD-10-CM | POA: Diagnosis not present

## 2019-07-19 DIAGNOSIS — I6529 Occlusion and stenosis of unspecified carotid artery: Secondary | ICD-10-CM

## 2019-07-19 DIAGNOSIS — S199XXA Unspecified injury of neck, initial encounter: Secondary | ICD-10-CM | POA: Diagnosis not present

## 2019-07-19 DIAGNOSIS — E119 Type 2 diabetes mellitus without complications: Secondary | ICD-10-CM | POA: Diagnosis not present

## 2019-07-19 DIAGNOSIS — I6521 Occlusion and stenosis of right carotid artery: Secondary | ICD-10-CM | POA: Diagnosis not present

## 2019-07-19 DIAGNOSIS — R519 Headache, unspecified: Secondary | ICD-10-CM | POA: Diagnosis not present

## 2019-07-19 DIAGNOSIS — M19042 Primary osteoarthritis, left hand: Secondary | ICD-10-CM | POA: Diagnosis present

## 2019-07-19 DIAGNOSIS — I63231 Cerebral infarction due to unspecified occlusion or stenosis of right carotid arteries: Secondary | ICD-10-CM | POA: Diagnosis not present

## 2019-07-19 DIAGNOSIS — Z791 Long term (current) use of non-steroidal anti-inflammatories (NSAID): Secondary | ICD-10-CM | POA: Diagnosis not present

## 2019-07-19 DIAGNOSIS — Z79899 Other long term (current) drug therapy: Secondary | ICD-10-CM | POA: Diagnosis not present

## 2019-07-19 DIAGNOSIS — M797 Fibromyalgia: Secondary | ICD-10-CM | POA: Diagnosis present

## 2019-07-19 DIAGNOSIS — Z87820 Personal history of traumatic brain injury: Secondary | ICD-10-CM | POA: Diagnosis not present

## 2019-07-19 DIAGNOSIS — E7800 Pure hypercholesterolemia, unspecified: Secondary | ICD-10-CM | POA: Diagnosis not present

## 2019-07-19 HISTORY — DX: Occlusion and stenosis of right carotid artery: I65.21

## 2019-07-19 HISTORY — DX: Essential (primary) hypertension: I10

## 2019-07-19 HISTORY — DX: Type 2 diabetes mellitus without complications: E11.9

## 2019-07-19 LAB — CBC
HCT: 46.1 % — ABNORMAL HIGH (ref 36.0–46.0)
Hemoglobin: 16.3 g/dL — ABNORMAL HIGH (ref 12.0–15.0)
MCH: 29.8 pg (ref 26.0–34.0)
MCHC: 35.4 g/dL (ref 30.0–36.0)
MCV: 84.3 fL (ref 80.0–100.0)
Platelets: 250 10*3/uL (ref 150–400)
RBC: 5.47 MIL/uL — ABNORMAL HIGH (ref 3.87–5.11)
RDW: 12.7 % (ref 11.5–15.5)
WBC: 9.8 10*3/uL (ref 4.0–10.5)
nRBC: 0 % (ref 0.0–0.2)

## 2019-07-19 LAB — GLUCOSE, CAPILLARY
Glucose-Capillary: 56 mg/dL — ABNORMAL LOW (ref 70–99)
Glucose-Capillary: 153 mg/dL — ABNORMAL HIGH (ref 70–99)

## 2019-07-19 LAB — BASIC METABOLIC PANEL
Anion gap: 8 (ref 5–15)
BUN: 17 mg/dL (ref 8–23)
CO2: 25 mmol/L (ref 22–32)
Calcium: 9.5 mg/dL (ref 8.9–10.3)
Chloride: 99 mmol/L (ref 98–111)
Creatinine, Ser: 0.74 mg/dL (ref 0.44–1.00)
GFR calc Af Amer: >60 mL/min (ref >60)
GFR calc non Af Amer: >60 mL/min (ref >60)
Glucose, Bld: 86 mg/dL (ref 70–99)
Potassium: 4.6 mmol/L (ref 3.5–5.1)
Sodium: 132 mmol/L — ABNORMAL LOW (ref 135–145)

## 2019-07-19 LAB — PROTIME-INR
INR: 0.9 (ref 0.8–1.2)
Prothrombin Time: 12.5 seconds (ref 11.4–15.2)

## 2019-07-19 IMAGING — MR MR HEAD WO/W CM
13 series · 48 of 48 positions shown · IV contrast (gadavist)
Comparison: None.

CLINICAL DATA: Visual changes.  Fall 2 weeks ago with head injury.

EXAM:
MRI HEAD WITHOUT AND WITH CONTRAST
TECHNIQUE: Multiplanar, multiecho pulse sequences of the brain and surrounding
structures were obtained without and with intravenous contrast.
CONTRAST:  7.5mL GADAVIST GADOBUTROL 1 MMOL/ML IV SOLN

[Series 7: cor dwi_tracew · coronal · 5.0mm · 1.31mm/px · 2 of 38 slices shown]
[im 1/38]
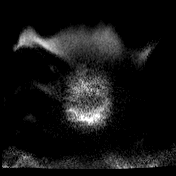
[im 38/38]
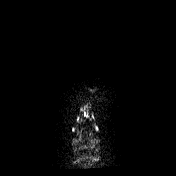

[Series 8: cor dwi_adc · coronal · 5.0mm · 1.31mm/px · 2 of 38 slices shown]
[im 1/38]
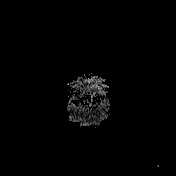
[im 38/38]
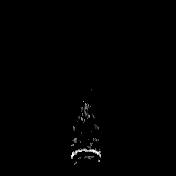

[Series 9: T1 · sagittal · 5.0mm · 0.62mm/px · 1 of 23 slices shown (1 of 2)]
[im 1/23]
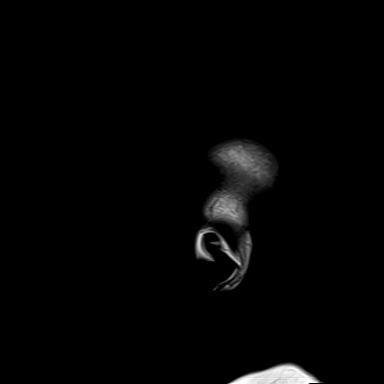

[Series 10: ax dwi_tracew · axial · 3.0mm · 0.60mm/px · z∈[-77,+75]mm · 3 of 48 slices shown]
[im 1/48]
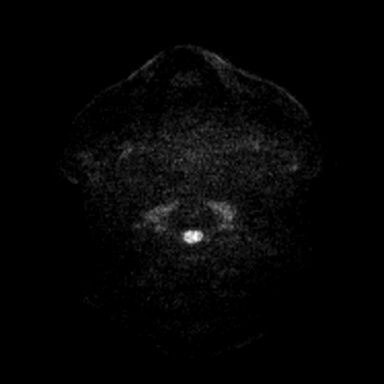
[im 24/48]
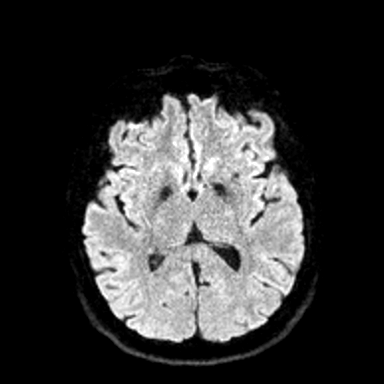
[im 48/48]
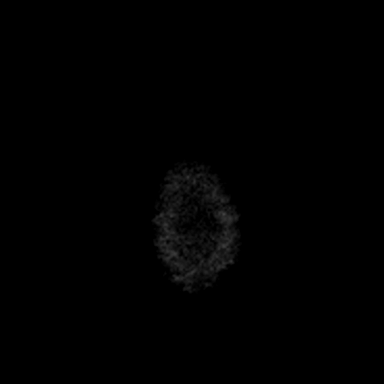

[Series 11: ax dwi_adc · axial · 3.0mm · 0.60mm/px · z∈[-77,+75]mm · 3 of 48 slices shown]
[im 1/48]
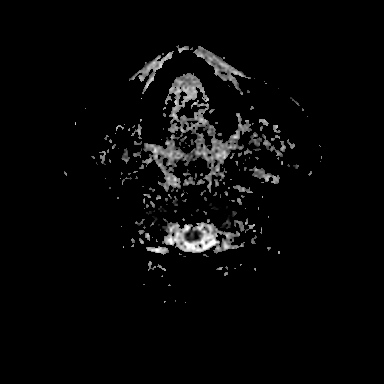
[im 24/48]
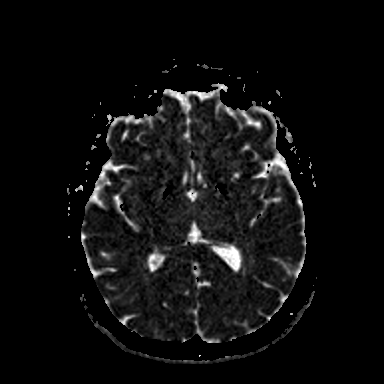
[im 48/48]
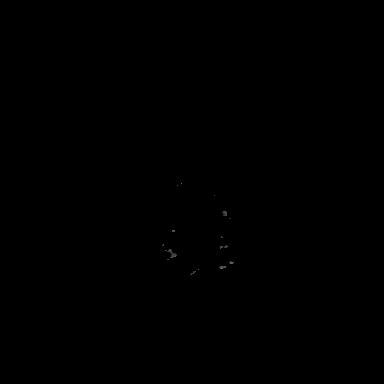

[Series 12: T2 · axial · 5.0mm · 0.53mm/px · z∈[-80,+74]mm · 2 of 27 slices shown]
[im 1/27]
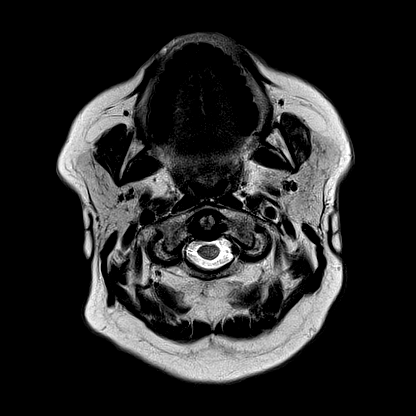
[im 27/27]
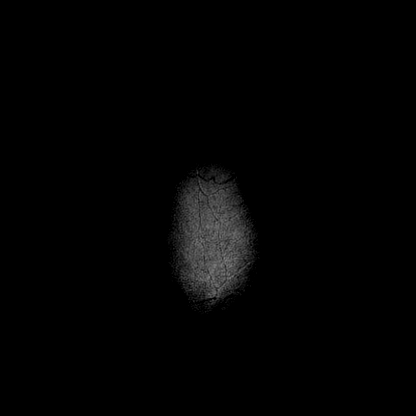

[Series 14: pha_images · axial · 3.0mm · 0.90mm/px · z∈[-84,+76]mm · 3 of 55 slices shown]
[im 1/55]
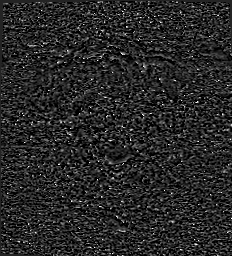
[im 28/55]
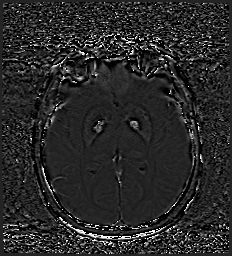
[im 55/55]
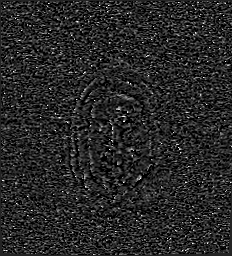

[Series 15: swi_images · axial · 3.0mm · 0.90mm/px · z∈[-84,+79]mm · 3 of 56 slices shown]
[im 1/56]
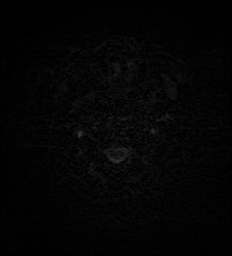
[im 28/56]
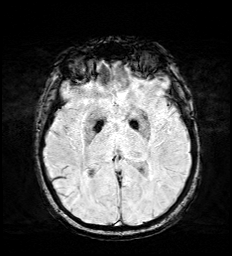
[im 56/56]
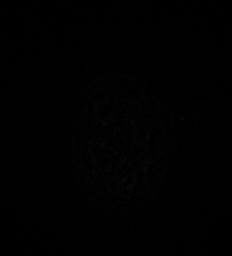

[Series 17: FLAIR · axial · 3.0mm · 0.53mm/px · z∈[-83,+77]mm · 3 of 55 slices shown]
[im 1/55]
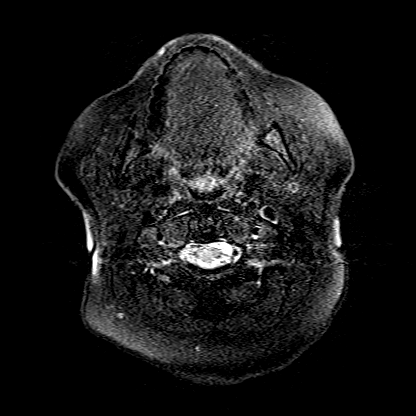
[im 28/55]
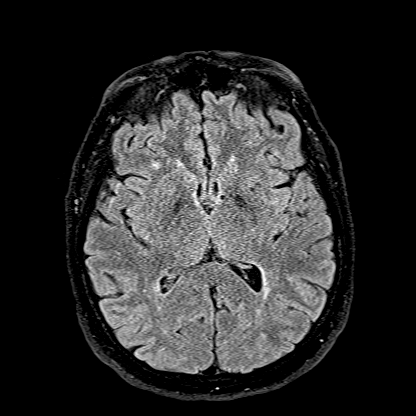
[im 55/55]
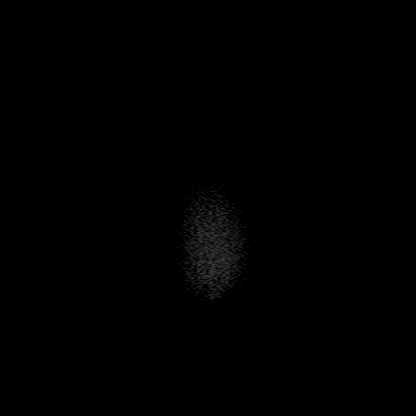

[Series 18: T1 · axial · 1.0mm · 0.98mm/px · z∈[-83,+90]mm · 11 of 174 slices shown (2 of 2)]
[im 1/174]
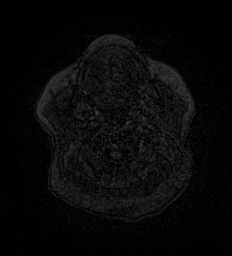
[im 18/174]
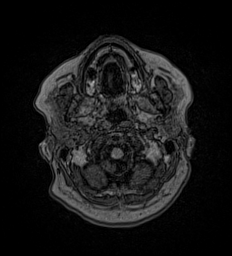
[im 35/174]
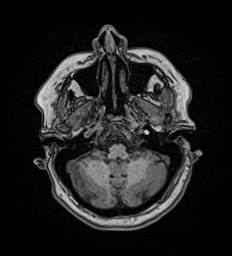
[im 52/174]
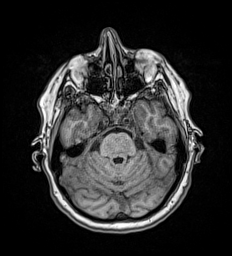
[im 70/174]
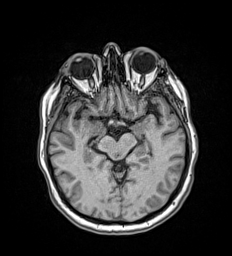
[im 87/174]
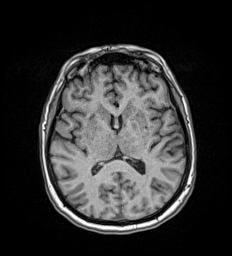
[im 104/174]
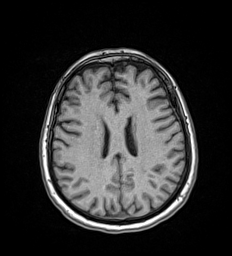
[im 122/174]
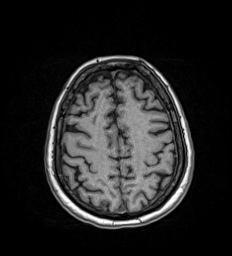
[im 139/174]
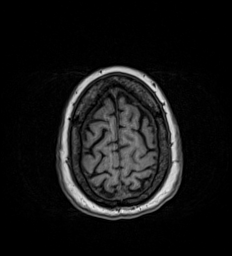
[im 156/174]
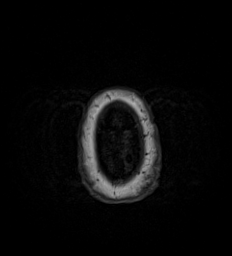
[im 174/174]
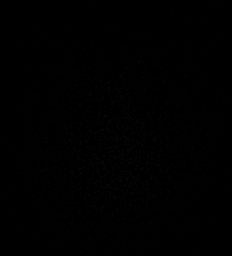

[Series 19: T2 post-contrast · coronal · 5.0mm · 0.57mm/px · 2 of 29 slices shown]
[im 1/29]
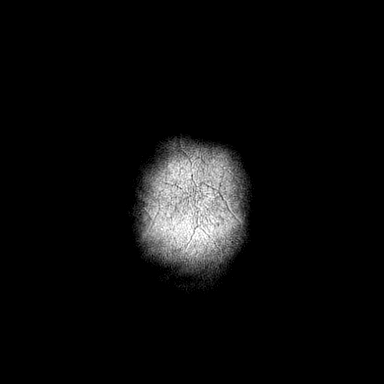
[im 29/29]
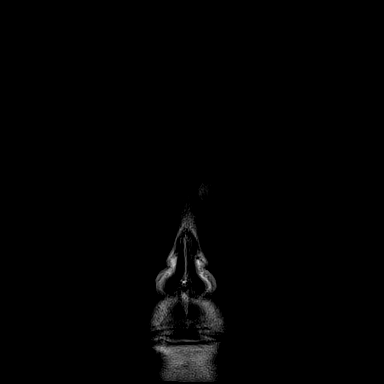

[Series 20: T1 post-contrast · axial · 1.0mm · 0.98mm/px · z∈[-83,+89]mm · 11 of 174 slices shown (1 of 2)]
[im 1/174]
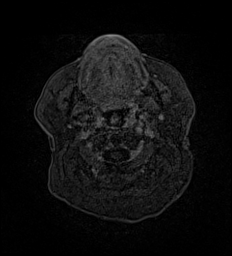
[im 18/174]
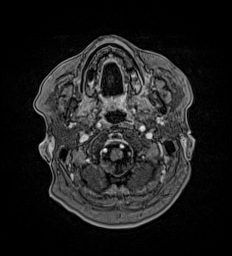
[im 35/174]
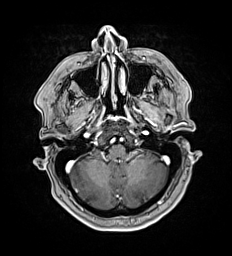
[im 52/174]
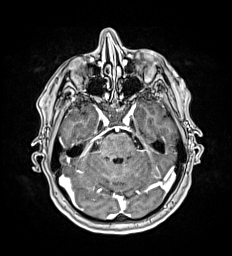
[im 70/174]
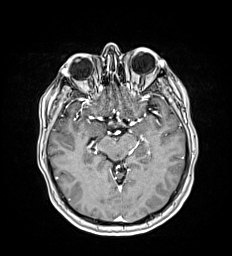
[im 87/174]
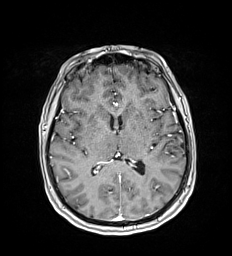
[im 104/174]
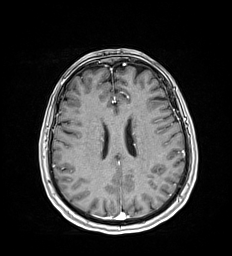
[im 122/174]
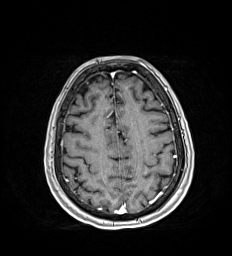
[im 139/174]
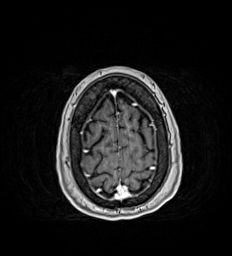
[im 156/174]
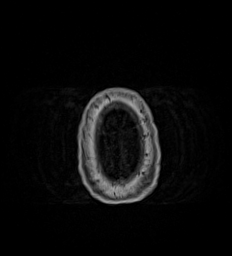
[im 174/174]
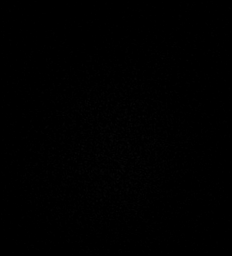

[Series 21: T1 post-contrast · coronal · 5.0mm · 0.57mm/px · 2 of 29 slices shown (2 of 2)]
[im 1/29]
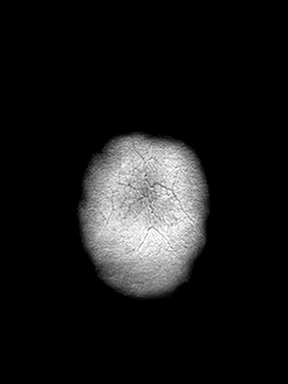
[im 29/29]
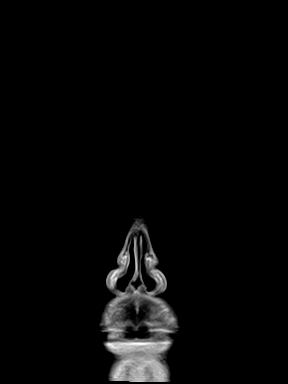

[48 of 48 positions shown; findings below may reference images not displayed]

FINDINGS: Brain: Ventricle size and cerebral volume normal. Negative for acute
infarct. Multiple scattered small white matter hyperintensities in
the cerebral white matter as well as in the pons compatible with
chronic ischemia. Negative for hemorrhage or mass.

Normal enhancement following contrast infusion. No fluid collection
or midline shift.

Vascular: Decreased caliber with wall thickening in the right
internal carotid artery in the distal cervical segment and skull
base. The vessel appears patent. Otherwise normal flow voids in the
left carotid and vertebrobasilar.

Skull and upper cervical spine: No focal skeletal lesion.

Sinuses/Orbits: Negative

Other: None
IMPRESSION: Moderate chronic microvascular ischemic change.  No acute infarct.

Abnormal thickening of the right internal carotid artery through the
skull base. Given history of recent fall and right eye droop, this
may represent dissection with Horner syndrome. Also possible is a
flow limiting proximal right internal carotid artery stenosis.
Recommend CTA head and neck for further evaluation.

These results were called by telephone at the time of interpretation
on [DATE] at [DATE] to provider JUMPER , who verbally
acknowledged these results.

## 2019-07-19 IMAGING — CT CT ANGIO HEAD
2 of 11 series · 7 of 33 positions shown · IV contrast (omnipaque)
Comparison: Brain MRI performed earlier the same day [DATE]

CLINICAL DATA: Question dissection; head trauma, headache.

EXAM:
CT ANGIOGRAPHY HEAD AND NECK
TECHNIQUE: Multidetector CT imaging of the head and neck was performed using
the standard protocol during bolus administration of intravenous
contrast. Multiplanar CT image reconstructions and MIPs were
obtained to evaluate the vascular anatomy. Carotid stenosis
measurements (when applicable) are obtained utilizing NASCET
criteria, using the distal internal carotid diameter as the
denominator.
CONTRAST:  75mL OMNIPAQUE IOHEXOL 350 MG/ML SOLN

[Series 509: cta head neck thins · axial · 0.44mm/px · z∈[-237,-19]mm · 5 of 657 slices shown]
[im 110/657  soft-tissue]
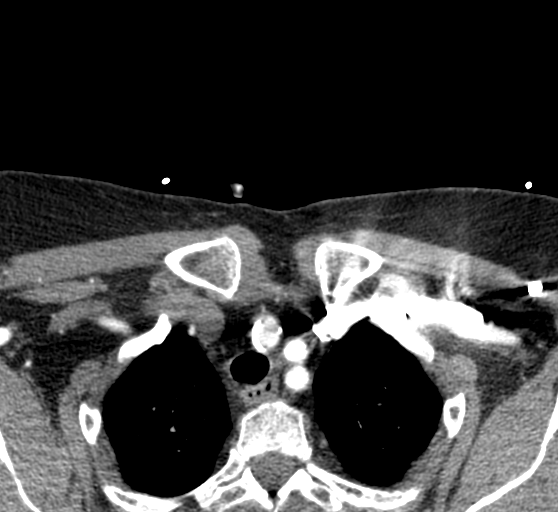
[im 219/657  bone]
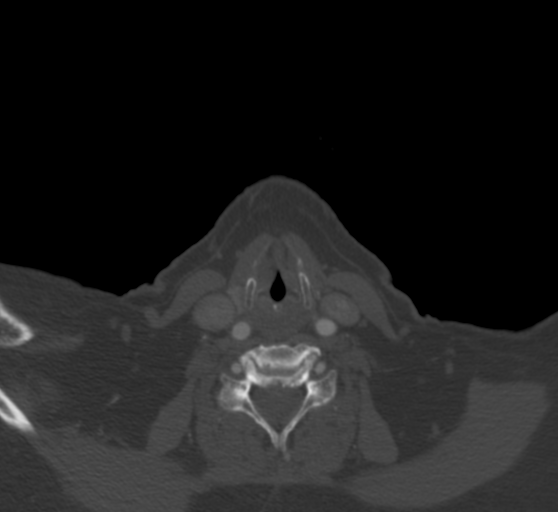
[im 329/657  soft-tissue]
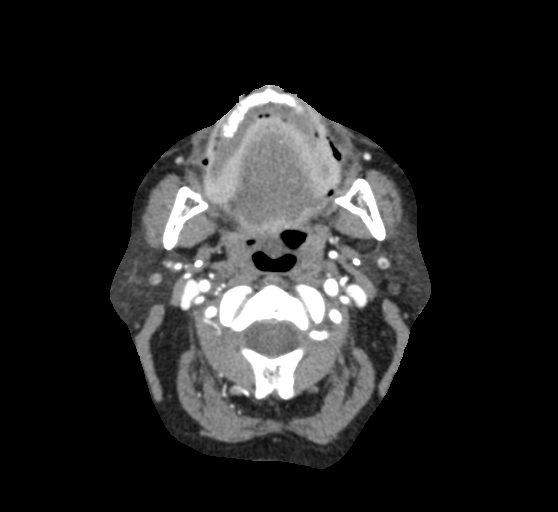
[im 438/657  bone]
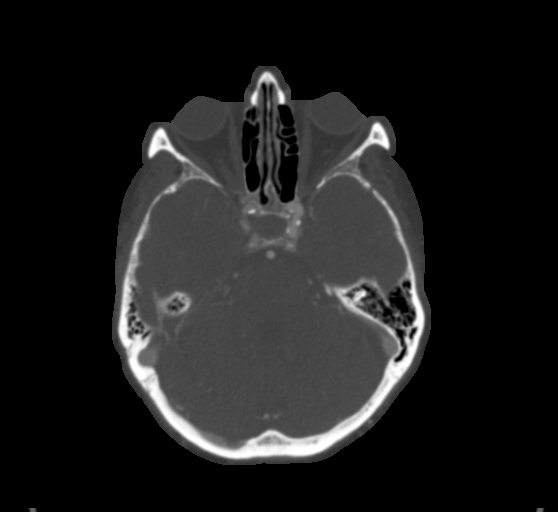
[im 547/657  soft-tissue]
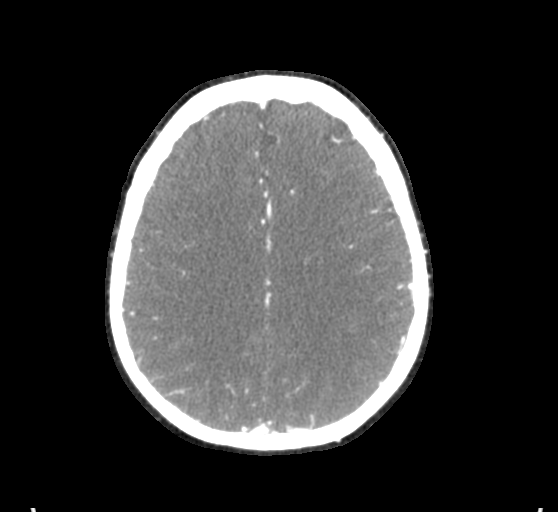

[Series 510: ax thin · axial · 0.44mm/px · z∈[-183,-74]mm · 2 of 329 slices shown]
[im 110/329  soft-tissue]
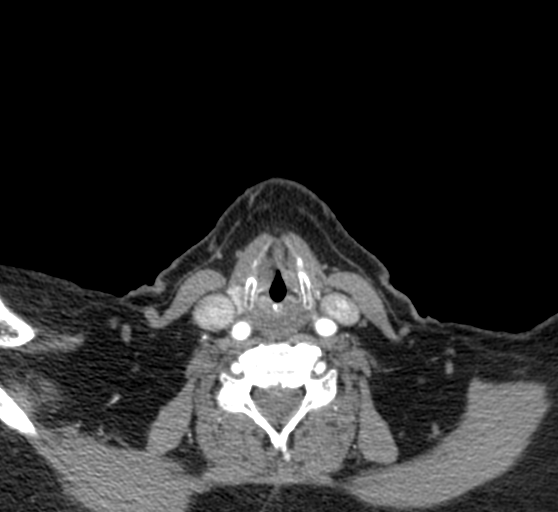
[im 219/329  soft-tissue]
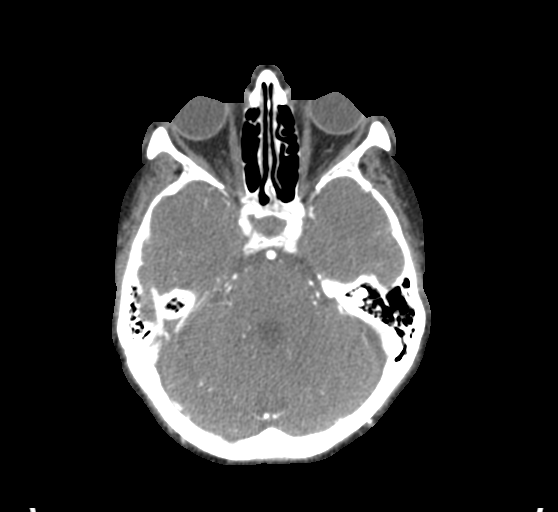

[7 of 33 positions shown; findings below may reference images not displayed]

FINDINGS: CT HEAD FINDINGS

Brain: There is no evidence of acute intracranial hemorrhage,
intracranial mass, midline shift or extra-axial fluid collection.No
demarcated cortical infarction. Moderate scattered hypodensity
within the cerebral white matter is nonspecific, but consistent with
chronic small vessel ischemic disease. Bilateral basal ganglia
calcification. Mild generalized parenchymal atrophy.

Vascular: Reported separately.

Skull: Normal. Negative for fracture or focal lesion.

Sinuses: No significant paranasal sinus disease or mastoid effusion
at the imaged levels.

Orbits: Visualized orbits demonstrate no acute abnormality.

Review of the MIP images confirms the above findings

CTA NECK FINDINGS

Aortic arch: Standard aortic branching. Mixed plaque within the
visualized aortic arch and proximal major branch vessels of the
neck. No significant innominate or proximal subclavian artery
stenosis.

Right carotid system: The CCA is patent to the bifurcation without
stenosis. There is prominent mixed plaque within the proximal ICA
with apparent severe stenosis. Exact quantification of stenosis is
difficult due to blooming from calcified plaque. Distal to this, the
right ICA is asymmetrically diminutive and with asymmetrically
decreased enhancement, although patent.

Left carotid system: The CCA is patent to the bifurcation without
stenosis. Mild to moderate mixed plaque within the carotid
bifurcation and proximal ICA. Less than 50% stenosis of the proximal
ICA.

Vertebral arteries: The vertebral arteries are codominant and patent
within the neck without stenosis

Skeleton: No acute bony abnormality. Cervical spondylosis without
high-grade bony spinal canal stenosis.

Other neck: No neck mass or cervical lymphadenopathy. Multiple
nodules within the left thyroid lobe measuring up to 9 mm.

Upper chest: No consolidation within the imaged lung apices

Review of the MIP images confirms the above findings

CTA HEAD FINDINGS

Anterior circulation:

The intracranial internal carotid arteries are patent. No evidence
of dissection within the proximal right ICA siphon. Calcified plaque
results in mild to moderate stenosis within the distal
cavernous/paraclinoid right ICA. Calcified plaque within the left
carotid siphon with no more than mild stenosis.

The middle cerebral arteries are patent without significant
stenosis. No M2 proximal branch occlusion or high-grade proximal
stenosis is identified.

Hypoplastic A1 right ACA. The anterior cerebral arteries are patent
bilaterally without high-grade proximal stenosis. Subtle nonspecific
use deformed dilation of the proximal A2 right ACA. Otherwise, no
intracranial aneurysm is identified.

Posterior circulation:

The intracranial vertebral arteries are patent without significant
stenosis, as is the basilar artery. The bilateral posterior cerebral
arteries are patent without significant proximal stenosis. There is
a large right posterior communicating artery. A left posterior
communicating artery is not definitively identified and may be
hypoplastic or absent.

Venous sinuses: Within limitations of contrast timing, no convincing
thrombus.

Anatomic variants: As described

Review of the MIP images confirms the above findings
IMPRESSION: CT head:

1. No evidence of acute intracranial abnormality.
2. Moderate chronic small vessel ischemic disease.

CTA neck:

1. Prominent mixed plaque results in apparent severe stenosis within
the proximal right ICA. Exact quantification of stenosis is
difficult due to blooming of prominent calcified plaque. Distal to
this, the right ICA is asymmetrically diminutive and with
asymmetrically decreased enhancement, although patent.
2. The left CCA and ICA are patent within the neck. Calcified plaque
results in less than 50% stenosis of the proximal ICA.
3. The vertebral arteries are patent within the neck without
stenosis.

CTA head:

1. Intracranially, the right ICA remains asymmetrically diminutive
and with asymmetrically decreased opacification as compared to the
left. This is likely related to the high-grade right ICA stenosis
within the neck. No evidence of dissection within the proximal right
ICA siphon.
2. No intracranial large vessel occlusion or high-grade proximal
arterial stenosis.
3. Calcified plaque within the ICA siphons bilaterally with sites of
up to mild/moderate stenosis on the right and no more than mild
stenosis on the left.
4. Subtle nonspecific fusiform dilation of the proximal A2 right ACA
without saccular aneurysm.

## 2019-07-19 IMAGING — CT CT ANGIO NECK
1 of 10 series · 6 of 33 positions shown · IV contrast (omnipaque)
Comparison: Brain MRI performed earlier the same day [DATE]

CLINICAL DATA: Question dissection; head trauma, headache.

EXAM:
CT ANGIOGRAPHY HEAD AND NECK
TECHNIQUE: Multidetector CT imaging of the head and neck was performed using
the standard protocol during bolus administration of intravenous
contrast. Multiplanar CT image reconstructions and MIPs were
obtained to evaluate the vascular anatomy. Carotid stenosis
measurements (when applicable) are obtained utilizing NASCET
criteria, using the distal internal carotid diameter as the
denominator.
CONTRAST:  75mL OMNIPAQUE IOHEXOL 350 MG/ML SOLN

[Series 10: ax thin · axial · 0.44mm/px · z∈[-246,-11]mm · 6 of 329 slices shown]
[im 47/329  soft-tissue]
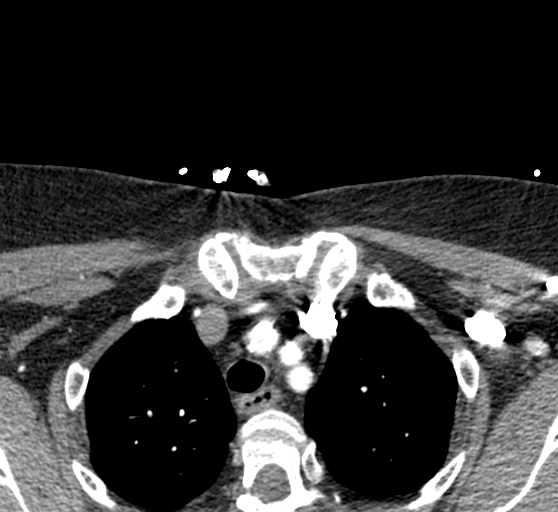
[im 94/329  bone]
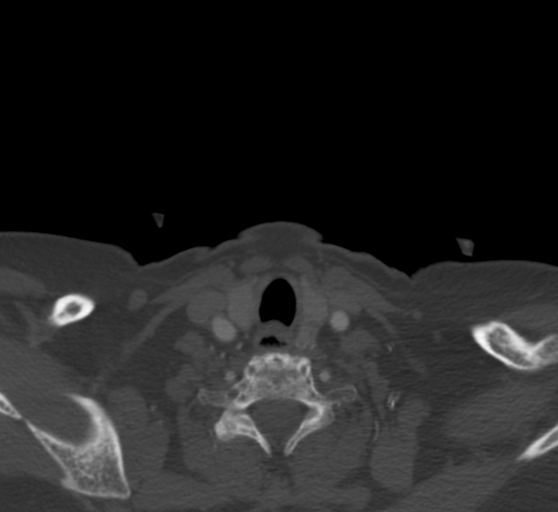
[im 141/329  soft-tissue]
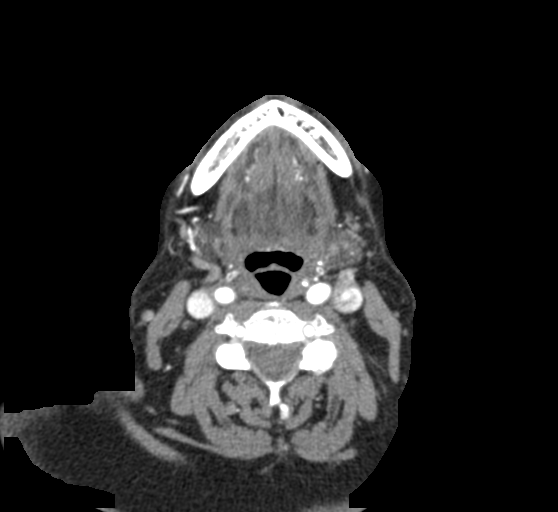
[im 188/329  bone]
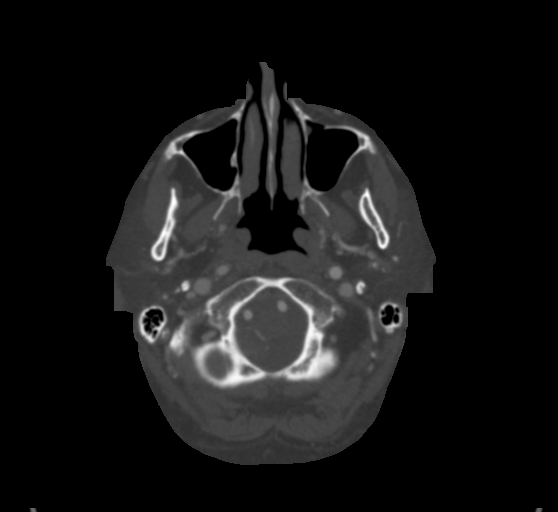
[im 235/329  soft-tissue]
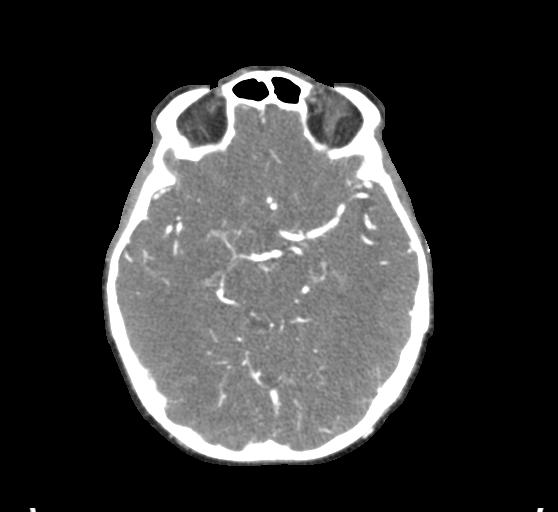
[im 282/329  bone]
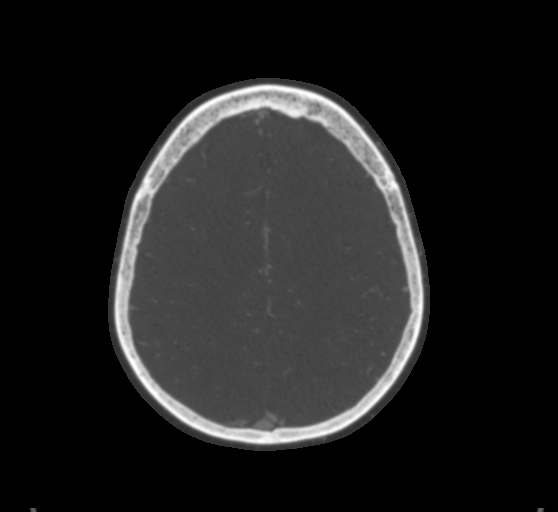

[6 of 33 positions shown; findings below may reference images not displayed]

FINDINGS: CT HEAD FINDINGS

Brain: There is no evidence of acute intracranial hemorrhage,
intracranial mass, midline shift or extra-axial fluid collection.No
demarcated cortical infarction. Moderate scattered hypodensity
within the cerebral white matter is nonspecific, but consistent with
chronic small vessel ischemic disease. Bilateral basal ganglia
calcification. Mild generalized parenchymal atrophy.

Vascular: Reported separately.

Skull: Normal. Negative for fracture or focal lesion.

Sinuses: No significant paranasal sinus disease or mastoid effusion
at the imaged levels.

Orbits: Visualized orbits demonstrate no acute abnormality.

Review of the MIP images confirms the above findings

CTA NECK FINDINGS

Aortic arch: Standard aortic branching. Mixed plaque within the
visualized aortic arch and proximal major branch vessels of the
neck. No significant innominate or proximal subclavian artery
stenosis.

Right carotid system: The CCA is patent to the bifurcation without
stenosis. There is prominent mixed plaque within the proximal ICA
with apparent severe stenosis. Exact quantification of stenosis is
difficult due to blooming from calcified plaque. Distal to this, the
right ICA is asymmetrically diminutive and with asymmetrically
decreased enhancement, although patent.

Left carotid system: The CCA is patent to the bifurcation without
stenosis. Mild to moderate mixed plaque within the carotid
bifurcation and proximal ICA. Less than 50% stenosis of the proximal
ICA.

Vertebral arteries: The vertebral arteries are codominant and patent
within the neck without stenosis

Skeleton: No acute bony abnormality. Cervical spondylosis without
high-grade bony spinal canal stenosis.

Other neck: No neck mass or cervical lymphadenopathy. Multiple
nodules within the left thyroid lobe measuring up to 9 mm.

Upper chest: No consolidation within the imaged lung apices

Review of the MIP images confirms the above findings

CTA HEAD FINDINGS

Anterior circulation:

The intracranial internal carotid arteries are patent. No evidence
of dissection within the proximal right ICA siphon. Calcified plaque
results in mild to moderate stenosis within the distal
cavernous/paraclinoid right ICA. Calcified plaque within the left
carotid siphon with no more than mild stenosis.

The middle cerebral arteries are patent without significant
stenosis. No M2 proximal branch occlusion or high-grade proximal
stenosis is identified.

Hypoplastic A1 right ACA. The anterior cerebral arteries are patent
bilaterally without high-grade proximal stenosis. Subtle nonspecific
use deformed dilation of the proximal A2 right ACA. Otherwise, no
intracranial aneurysm is identified.

Posterior circulation:

The intracranial vertebral arteries are patent without significant
stenosis, as is the basilar artery. The bilateral posterior cerebral
arteries are patent without significant proximal stenosis. There is
a large right posterior communicating artery. A left posterior
communicating artery is not definitively identified and may be
hypoplastic or absent.

Venous sinuses: Within limitations of contrast timing, no convincing
thrombus.

Anatomic variants: As described

Review of the MIP images confirms the above findings
IMPRESSION: CT head:

1. No evidence of acute intracranial abnormality.
2. Moderate chronic small vessel ischemic disease.

CTA neck:

1. Prominent mixed plaque results in apparent severe stenosis within
the proximal right ICA. Exact quantification of stenosis is
difficult due to blooming of prominent calcified plaque. Distal to
this, the right ICA is asymmetrically diminutive and with
asymmetrically decreased enhancement, although patent.
2. The left CCA and ICA are patent within the neck. Calcified plaque
results in less than 50% stenosis of the proximal ICA.
3. The vertebral arteries are patent within the neck without
stenosis.

CTA head:

1. Intracranially, the right ICA remains asymmetrically diminutive
and with asymmetrically decreased opacification as compared to the
left. This is likely related to the high-grade right ICA stenosis
within the neck. No evidence of dissection within the proximal right
ICA siphon.
2. No intracranial large vessel occlusion or high-grade proximal
arterial stenosis.
3. Calcified plaque within the ICA siphons bilaterally with sites of
up to mild/moderate stenosis on the right and no more than mild
stenosis on the left.
4. Subtle nonspecific fusiform dilation of the proximal A2 right ACA
without saccular aneurysm.

## 2019-07-19 MED ORDER — HYDRALAZINE HCL 20 MG/ML IJ SOLN
10.0000 mg | INTRAMUSCULAR | Status: DC | PRN
  Administered 2019-07-19: 10 mg via INTRAVENOUS
  Filled 2019-07-19: qty 1

## 2019-07-19 MED ORDER — OLMESARTAN MEDOXOMIL-HCTZ 40-12.5 MG PO TABS: 1.0000 | ORAL_TABLET | Freq: Every day | ORAL | Status: DC

## 2019-07-19 MED ORDER — ASPIRIN 81 MG PO CHEW
324.0000 mg | CHEWABLE_TABLET | Freq: Once | ORAL | Status: AC
  Administered 2019-07-19: 324 mg via ORAL
  Filled 2019-07-19: qty 4

## 2019-07-19 MED ORDER — INSULIN ASPART 100 UNIT/ML ~~LOC~~ SOLN
0.0000 [IU] | Freq: Three times a day (TID) | SUBCUTANEOUS | Status: DC
  Administered 2019-07-20 – 2019-07-21 (×3): 2 [IU] via SUBCUTANEOUS
  Filled 2019-07-19 (×3): qty 1

## 2019-07-19 MED ORDER — SODIUM CHLORIDE 0.9% FLUSH: 3.0000 mL | INTRAVENOUS | Status: DC | PRN

## 2019-07-19 MED ORDER — HYDROCHLOROTHIAZIDE 12.5 MG PO CAPS
12.5000 mg | ORAL_CAPSULE | Freq: Every day | ORAL | Status: DC
  Administered 2019-07-20 – 2019-07-21 (×2): 12.5 mg via ORAL
  Filled 2019-07-19 (×2): qty 1

## 2019-07-19 MED ORDER — ASPIRIN 81 MG PO CHEW
81.0000 mg | CHEWABLE_TABLET | Freq: Every day | ORAL | Status: DC
  Administered 2019-07-20 – 2019-07-21 (×2): 81 mg via ORAL
  Filled 2019-07-19 (×2): qty 1

## 2019-07-19 MED ORDER — INSULIN GLARGINE 100 UNIT/ML ~~LOC~~ SOLN
30.0000 [IU] | Freq: Every day | SUBCUTANEOUS | Status: DC
  Administered 2019-07-20 – 2019-07-21 (×2): 30 [IU] via SUBCUTANEOUS
  Filled 2019-07-19 (×2): qty 0.3

## 2019-07-19 MED ORDER — SODIUM CHLORIDE 0.9% FLUSH
3.0000 mL | Freq: Two times a day (BID) | INTRAVENOUS | Status: DC
  Administered 2019-07-19 – 2019-07-21 (×4): 3 mL via INTRAVENOUS

## 2019-07-19 MED ORDER — ATORVASTATIN CALCIUM 20 MG PO TABS
80.0000 mg | ORAL_TABLET | Freq: Every day | ORAL | Status: DC
  Administered 2019-07-19 – 2019-07-20 (×2): 80 mg via ORAL
  Filled 2019-07-19 (×2): qty 4

## 2019-07-19 MED ORDER — DEXTROSE 50 % IV SOLN
1.0000 | Freq: Once | INTRAVENOUS | Status: AC
  Administered 2019-07-19: 50 mL via INTRAVENOUS

## 2019-07-19 MED ORDER — CARVEDILOL 6.25 MG PO TABS
6.2500 mg | ORAL_TABLET | ORAL | Status: AC
  Administered 2019-07-19: 6.25 mg via ORAL
  Filled 2019-07-19: qty 1

## 2019-07-19 MED ORDER — IOHEXOL 350 MG/ML SOLN
75.0000 mL | Freq: Once | INTRAVENOUS | Status: AC | PRN
  Administered 2019-07-19: 75 mL via INTRAVENOUS

## 2019-07-19 MED ORDER — IRBESARTAN 150 MG PO TABS
300.0000 mg | ORAL_TABLET | Freq: Every day | ORAL | Status: DC
  Administered 2019-07-20 – 2019-07-21 (×2): 300 mg via ORAL
  Filled 2019-07-19 (×2): qty 2

## 2019-07-19 MED ORDER — SODIUM CHLORIDE 0.9 % IV SOLN: 250.0000 mL | INTRAVENOUS | Status: DC | PRN

## 2019-07-19 MED ORDER — HEPARIN SODIUM (PORCINE) 5000 UNIT/ML IJ SOLN
5000.0000 [IU] | Freq: Three times a day (TID) | INTRAMUSCULAR | Status: DC
  Administered 2019-07-19 – 2019-07-21 (×5): 5000 [IU] via SUBCUTANEOUS
  Filled 2019-07-19 (×5): qty 1

## 2019-07-19 MED ORDER — CARVEDILOL 6.25 MG PO TABS
6.2500 mg | ORAL_TABLET | Freq: Two times a day (BID) | ORAL | Status: DC
  Administered 2019-07-20 – 2019-07-21 (×3): 6.25 mg via ORAL
  Filled 2019-07-19 (×3): qty 1

## 2019-07-19 MED ORDER — GADOBUTROL 1 MMOL/ML IV SOLN
7.5000 mL | Freq: Once | INTRAVENOUS | Status: AC | PRN
  Administered 2019-07-19: 14:00:00 7.5 mL via INTRAVENOUS
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
## 2019-07-19 NOTE — ED Notes (Signed)
Pt changing into gown. Explained next few steps. Pt agreeable and cooperative. A&Ox4 currently.

## 2019-07-19 NOTE — ED Notes (Signed)
Tele-neuro cart setup at bedside.

## 2019-07-19 NOTE — ED Notes (Signed)
Stat neuro consult called per Jacqualine Code, MD

## 2019-07-19 NOTE — Consult Note (Signed)
[facility] Tele[facility]urology Consult Services  Stat Consult  Date of Service:   07/19/2019 19:21:26  Impression:     .  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 This obtained after she had a fall one week ago and developed acute right eye ptosis. She denies having a headache, blurred vision or speech difficulties. MRI brain today showed: moderate chronic microvascular ischemic change. No acute infarct. Abnormal thickening of the right internal carotid artery through the skull base. 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 Also possible is a flow limiting proximal right internal carotid artery stenosis. CTA of the head and neck showed: 1. Prominent mixed plaque results in apparent severe stenosis within the proximal right ICA. Exact quantification of stenosis is difficult due to blooming of prominent calcified plaque. Distal to this, the right ICA is asymmetrically diminutive and with asymmetrically decreased enhancement, although patent. 2. The left CCA and ICA are patent within the neck. Calcified plaque results in less than 50% stenosis of the proximal ICA. 3. The vertebral arteries are patent within the neck without stenosis.   CTA head: 1. Intracranially, the right ICA remains asymmetrically diminutive and with asymmetrically decreased opacification as compared to the left. This is likely related to the high-grade right ICA stenosis within the neck. No evidence of dissection within the proximal right ICA siphon. 2. No intracranial large vessel occlusion or high-grade proximal arterial stenosis. 3. Calcified plaque within the ICA siphons bilaterally with sites of up to mild/moderate stenosis on the right and  no more than mild stenosis on the left. 4. Subtle nonspecific fusiform dilation of the proximal A2 right ACA without saccular aneurysm.  Metrics: [facility] Notification Time: 07/19/2019 19:20:35 Stamp Time: 07/19/2019 19:21:26 Callback Response Time: 07/19/2019 19:24:18  Our recommendations are outlined below.  Recommendations:     .  Antiplatelet Therapy     .  Vascular surgery consult re: severe right ICA stenosis.     .  Consider checking antibodies for possible ocular myasthenia gravis.    Disposition: Neurology Follow Up Recommended  Sign Out:     .  Discussed with Emergency Department Provider  ----------------------------------------------------------------------------------------------------  Chief Complaint: Right eye ptosis and possible carotid artery dissection.  History of Present Illness: Patient is a 74 year old Female.  74 year old female presents to the ED after her physician ordered an MRI brain which showed a possible carotid artery dissection. This obtained after she had a fall one week ago and developed acute right eye ptosis. She denies having a headache, blurred vision or speech difficulties. MRI brain today showed: moderate chronic microvascular ischemic change. No acute infarct. Abnormal thickening of the right internal carotid artery through the skull base. 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 Also possible is a flow limiting proximal right internal carotid artery stenosis. CTA of the head and neck showed: 1. Prominent mixed plaque results in apparent severe stenosis within the proximal right ICA. Exact quantification of stenosis is difficult due to blooming of prominent calcified plaque. Distal to this, the right ICA is asymmetrically diminutive and with asymmetrically decreased enhancement, although patent. 2. The left CCA and  ICA are patent within the neck.  Calcified plaque results in less than 50% stenosis of the proximal ICA. 3. The vertebral arteries are patent within the neck without stenosis.   CTA head: 1. Intracranially, the right ICA remains asymmetrically diminutive and with asymmetrically decreased opacification as compared to the left. This is likely related to the high-grade right ICA stenosis within the neck. No evidence of dissection within the proximal right ICA siphon. 2. No intracranial large vessel occlusion or high-grade proximal arterial stenosis. 3. Calcified plaque within the ICA siphons bilaterally with sites of up to mild/moderate stenosis on the right and no more than mild stenosis on the left. 4. Subtle nonspecific fusiform dilation of the proximal A2 right ACA without saccular aneurysm.           Examination: BP(152/78), Pulse(80), Blood Glucose(56)  Neuro Exam:  General: Alert,Awake, Oriented to Time, Place, Person  Speech: Fluent:  Language: Intact:  Face: Symmetric:  Facial Sensation: Intact:  Visual Fields: Intact:  Extraocular Movements: Intact:  Motor Exam: No Drift: RUE, RLE, LUE, LLE  Sensation: Intact: RUE, RLE, LUE, LLE  Coordination: Intact: RUE, RLE  Mild right eye ptosis noted.   Patient/Family was informed the Neurology Consult would occur via TeleHealth consult by way of interactive audio and video telecommunications and consented to receiving care in this manner.  Due to the immediate potential for life-threatening deterioration due to underlying acute neurologic illness, I spent 45 minutes providing critical care. This time includes time for face to face visit via telemedicine, review of medical records, imaging studies and discussion of findings with providers, the patient and/or family.   Dr Ellin Goodie   [facility] [phone]  Case BF:[id number]

## 2019-07-19 NOTE — ED Triage Notes (Signed)
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  Pt fell two weeks ago, one week ago started with right eye drooping.  MRI today for the eye droop and sent to ED for CTA
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
## 2019-07-19 NOTE — ED Notes (Signed)
Pt currently alert and relaxed in bed. Denies CP or HA.

## 2019-07-19 NOTE — ED Notes (Signed)
Lights dimmed for pt. Call bell within reach. Rails up. Bed locked low. Pt given warm blankets. Pt denies any needs currently.
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
## 2019-07-20 ENCOUNTER — Encounter: Payer: Self-pay | Admitting: Obstetrics and Gynecology

## 2019-07-20 DIAGNOSIS — E871 Hypo-osmolality and hyponatremia: Secondary | ICD-10-CM

## 2019-07-20 DIAGNOSIS — H02401 Unspecified ptosis of right eyelid: Secondary | ICD-10-CM

## 2019-07-20 LAB — BASIC METABOLIC PANEL
Anion gap: 12 (ref 5–15)
BUN: 18 mg/dL (ref 8–23)
CO2: 27 mmol/L (ref 22–32)
Calcium: 9.1 mg/dL (ref 8.9–10.3)
Chloride: 98 mmol/L (ref 98–111)
Creatinine, Ser: 0.68 mg/dL (ref 0.44–1.00)
GFR calc Af Amer: >60 mL/min (ref >60)
GFR calc non Af Amer: >60 mL/min (ref >60)
Glucose, Bld: 146 mg/dL — ABNORMAL HIGH (ref 70–99)
Potassium: 3.7 mmol/L (ref 3.5–5.1)
Sodium: 137 mmol/L (ref 135–145)

## 2019-07-20 LAB — CBC
HCT: 44 % (ref 36.0–46.0)
Hemoglobin: 15.1 g/dL — ABNORMAL HIGH (ref 12.0–15.0)
MCH: 29.7 pg (ref 26.0–34.0)
MCHC: 34.3 g/dL (ref 30.0–36.0)
MCV: 86.6 fL (ref 80.0–100.0)
Platelets: 243 10*3/uL (ref 150–400)
RBC: 5.08 MIL/uL (ref 3.87–5.11)
RDW: 12.6 % (ref 11.5–15.5)
WBC: 9.5 10*3/uL (ref 4.0–10.5)
nRBC: 0 % (ref 0.0–0.2)

## 2019-07-20 LAB — HEMOGLOBIN A1C
Hgb A1c MFr Bld: 6.3 % — ABNORMAL HIGH (ref 4.8–5.6)
Mean Plasma Glucose: 134.11 mg/dL

## 2019-07-20 LAB — SARS CORONAVIRUS 2 (TAT 6-24 HRS): SARS Coronavirus 2: NEGATIVE

## 2019-07-20 MED ORDER — TRAZODONE HCL 50 MG PO TABS
50.0000 mg | ORAL_TABLET | Freq: Every evening | ORAL | Status: DC | PRN
  Administered 2019-07-20: 50 mg via ORAL
  Filled 2019-07-20: qty 1

## 2019-07-20 MED ORDER — ACETAMINOPHEN 325 MG PO TABS: 650.0000 mg | ORAL_TABLET | Freq: Four times a day (QID) | ORAL | Status: DC | PRN

## 2019-07-20 MED ORDER — CLOPIDOGREL BISULFATE 75 MG PO TABS
75.0000 mg | ORAL_TABLET | Freq: Every day | ORAL | Status: DC
  Administered 2019-07-20 – 2019-07-21 (×2): 75 mg via ORAL
  Filled 2019-07-20 (×2): qty 1

## 2019-07-20 MED ORDER — MORPHINE SULFATE (PF) 2 MG/ML IV SOLN: 2.0000 mg | Freq: Four times a day (QID) | INTRAVENOUS | Status: DC | PRN

## 2019-07-20 MED ORDER — OXYCODONE HCL 5 MG PO TABS
5.0000 mg | ORAL_TABLET | Freq: Four times a day (QID) | ORAL | Status: DC | PRN
  Administered 2019-07-20: 5 mg via ORAL
  Filled 2019-07-20: qty 1

## 2019-07-20 MED ORDER — ACETAMINOPHEN 325 MG PO TABS
ORAL_TABLET | ORAL | Status: AC
  Administered 2019-07-20: 650 mg via ORAL
  Filled 2019-07-20: qty 2

## 2019-07-20 NOTE — Plan of Care (Signed)
Continuing with plan of care. 

## 2019-07-20 NOTE — Progress Notes (Signed)
Progress Note    Jennifer Shelton  [id number] DOB: 01-23-1946  DOA: 07/19/2019 PCP: Rusty Aus, MD      Brief Narrative:    Medical records reviewed and are as summarized below:  Jennifer Shelton is an 74 y.o. female with history of hypertension, insulin-dependent diabetes mellitus, who presented to the hospital with headache after a fall.  She said that she had stepped on a chair to reach for something and she tripped and fell.  She developed severe headache, dizziness and also noticed drooping of her right eyelid.  She also reported feeling unsteady on her feet.  Work-up revealed severe right ICA stenosis      Assessment/Plan:   Principal Problem:   Internal carotid artery stenosis, right Active Problems:   HTN (hypertension)   T2DM (type 2 diabetes mellitus) (HCC)   Ptosis of eyelid, right   Hyponatremia  Right internal carotid artery stenosis: Patient has been evaluated by neurologist and vascular surgeon.  Neurologist recommended dual antiplatelet therapy with aspirin and Plavix.  Continue Lipitor.  Dizziness, headache, unsteady gait, mechanical fall, right eye ptosis: MRI brain did not show any evidence of acute stroke.  Analgesics as needed for pain.  PT evaluation for unsteady gait.  Neurologist recommends dual antiplatelet therapy.  Hypertension: Continue antihypertensives  Hyponatremia: Improved  Body mass index is 31.46 kg/m.   Family Communication/Anticipated D/C date and plan/Code Status   DVT prophylaxis: Heparin Code Status: Full code Family Communication: Plan discussed with her son over the phone Disposition Plan: Possible to discharge to home tomorrow when headache, dizziness improve      Subjective:   C/o headache, unsteady gait, dizziness  Objective:    Vitals:   07/20/19 0420 07/20/19 0800 07/20/19 1147 07/20/19 1514  BP: (!) 137/54 (!) 127/58 (!) 129/59 (!) 149/74  Pulse: 75 90 86 77  Resp: 20  16   Temp: 98.6 F (37  C)  97.9 F (36.6 C)   TempSrc:   Oral   SpO2: 96%  94%   Weight:      Height:        Intake/Output Summary (Last 24 hours) at 07/20/2019 1535 Last data filed at 07/20/2019 [id number] Gross per 24 hour  Intake 10 ml  Output 300 ml  Net -290 ml   Filed Weights   07/19/19 1540  Weight: 78 kg    Exam:  GEN: NAD SKIN: No rash EYES: EOMI, mild right ptosis ENT: MMM CV: RRR PULM: CTA B ABD: soft, ND, NT, +BS CNS: AAO x 3, non focal EXT: No edema or tenderness   Data Reviewed:   I have personally reviewed following labs and imaging studies:  Labs: Labs show the following:   Basic Metabolic Panel: Recent Labs  Lab 07/19/19 1716 07/20/19 0509  NA 132* 137  K 4.6 3.7  CL 99 98  CO2 25 27  GLUCOSE 86 146*  BUN 17 18  CREATININE 0.74 0.68  CALCIUM 9.5 9.1   GFR Estimated Creatinine Clearance: 62.4 mL/min (by C-G formula based on SCr of 0.68 mg/dL). Liver Function Tests: No results for input(s): AST, ALT, ALKPHOS, BILITOT, PROT, ALBUMIN in the last 168 hours. No results for input(s): LIPASE, AMYLASE in the last 168 hours. No results for input(s): AMMONIA in the last 168 hours. Coagulation profile Recent Labs  Lab 07/19/19 1716  INR 0.9    CBC: Recent Labs  Lab 07/19/19 1716 07/20/19 0509  WBC 9.8 9.5  HGB 16.3* 15.1*  HCT 46.1* 44.0  MCV 84.3 86.6  PLT 250 243   Cardiac Enzymes: No results for input(s): CKTOTAL, CKMB, CKMBINDEX, TROPONINI in the last 168 hours. BNP (last 3 results) No results for input(s): PROBNP in the last 8760 hours. CBG: Recent Labs  Lab 07/19/19 1900 07/19/19 2026  GLUCAP 56* 153*   D-Dimer: No results for input(s): DDIMER in the last 72 hours. Hgb A1c: Recent Labs    07/19/19 1716  HGBA1C 6.3*   Lipid Profile: No results for input(s): CHOL, HDL, LDLCALC, TRIG, CHOLHDL, LDLDIRECT in the last 72 hours. Thyroid function studies: No results for input(s): TSH, T4TOTAL, T3FREE, THYROIDAB in the last 72 hours.  Invalid  input(s): FREET3 Anemia work up: No results for input(s): VITAMINB12, FOLATE, FERRITIN, TIBC, IRON, RETICCTPCT in the last 72 hours. Sepsis Labs: Recent Labs  Lab 07/19/19 1716 07/20/19 0509  WBC 9.8 9.5    Microbiology Recent Results (from the past 240 hour(s))  SARS CORONAVIRUS 2 (TAT 6-24 HRS) Nasopharyngeal Nasopharyngeal Swab     Status: None   Collection Time: 07/19/19  5:16 PM   Specimen: Nasopharyngeal Swab  Result Value Ref Range Status   SARS Coronavirus 2 NEGATIVE NEGATIVE Final    Comment: (NOTE) SARS-CoV-2 target nucleic acids are NOT DETECTED. The SARS-CoV-2 RNA is generally detectable in upper and lower respiratory specimens during the acute phase of infection. Negative results do not preclude SARS-CoV-2 infection, do not rule out co-infections with other pathogens, and should not be used as the sole basis for treatment or other patient management decisions. Negative results must be combined with clinical observations, patient history, and epidemiological information. The expected result is Negative. Fact Sheet for Patients: SugarRoll.be Fact Sheet for Healthcare Providers: https://www.woods-mathews.com/ This test is not yet approved or cleared by the [place] FDA and  has been authorized for detection and/or diagnosis of SARS-CoV-2 by FDA under an Emergency Use Authorization (EUA). This EUA will remain  in effect (meaning this test can be used) for the duration of the COVID-19 declaration under Section 56 4(b)(1) of the Act, 21 U.S.C. section 360bbb-3(b)(1), unless the authorization is terminated or revoked sooner. Performed at [facility], [place] [address], [place], [place] [address]     Procedures and diagnostic studies:  CT Angio Head W or Wo Contrast  Result Date: 07/19/2019 CLINICAL DATA:  Question dissection; head trauma, headache. EXAM: CT ANGIOGRAPHY HEAD AND NECK TECHNIQUE: Multidetector CT  imaging of the head and neck was performed using the standard protocol during bolus administration of intravenous contrast. Multiplanar CT image reconstructions and MIPs were obtained to evaluate the vascular anatomy. Carotid stenosis measurements (when applicable) are obtained utilizing NASCET criteria, using the distal internal carotid diameter as the denominator. CONTRAST:  52mL OMNIPAQUE IOHEXOL 350 MG/ML SOLN COMPARISON:  Brain MRI performed earlier the same day 07/19/2019 FINDINGS: CT HEAD FINDINGS Brain: There is no evidence of acute intracranial hemorrhage, intracranial mass, midline shift or extra-axial fluid collection.No demarcated cortical infarction. Moderate scattered hypodensity within the cerebral white matter is nonspecific, but consistent with chronic small vessel ischemic disease. Bilateral basal ganglia calcification. Mild generalized parenchymal atrophy. Vascular: Reported separately. Skull: Normal. Negative for fracture or focal lesion. Sinuses: No significant paranasal sinus disease or mastoid effusion at the imaged levels. Orbits: Visualized orbits demonstrate no acute abnormality. Review of the MIP images confirms the above findings CTA NECK FINDINGS Aortic arch: Standard aortic branching. Mixed plaque within the visualized aortic arch and proximal major branch vessels of the neck. No  significant innominate or proximal subclavian artery stenosis. Right carotid system: The CCA is patent to the bifurcation without stenosis. There is prominent mixed plaque within the proximal ICA with apparent severe stenosis. Exact quantification of stenosis is difficult due to blooming from calcified plaque. Distal to this, the right ICA is asymmetrically diminutive and with asymmetrically decreased enhancement, although patent. Left carotid system: The CCA is patent to the bifurcation without stenosis. Mild to moderate mixed plaque within the carotid bifurcation and proximal ICA. Less than 50% stenosis of  the proximal ICA. Vertebral arteries: The vertebral arteries are codominant and patent within the neck without stenosis Skeleton: No acute bony abnormality. Cervical spondylosis without high-grade bony spinal canal stenosis. Other neck: No neck mass or cervical lymphadenopathy. Multiple nodules within the left thyroid lobe measuring up to 9 mm. Upper chest: No consolidation within the imaged lung apices Review of the MIP images confirms the above findings CTA HEAD FINDINGS Anterior circulation: The intracranial internal carotid arteries are patent. No evidence of dissection within the proximal right ICA siphon. Calcified plaque results in mild to moderate stenosis within the distal cavernous/paraclinoid right ICA. Calcified plaque within the left carotid siphon with no more than mild stenosis. The middle cerebral arteries are patent without significant stenosis. No M2 proximal branch occlusion or high-grade proximal stenosis is identified. Hypoplastic A1 right ACA. The anterior cerebral arteries are patent bilaterally without high-grade proximal stenosis. Subtle nonspecific use deformed dilation of the proximal A2 right ACA. Otherwise, no intracranial aneurysm is identified. Posterior circulation: The intracranial vertebral arteries are patent without significant stenosis, as is the basilar artery. The bilateral posterior cerebral arteries are patent without significant proximal stenosis. There is a large right posterior communicating artery. A left posterior communicating artery is not definitively identified and may be hypoplastic or absent. Venous sinuses: Within limitations of contrast timing, no convincing thrombus. Anatomic variants: As described Review of the MIP images confirms the above findings IMPRESSION: CT head: 1. No evidence of acute intracranial abnormality. 2. Moderate chronic small vessel ischemic disease. CTA neck: 1. Prominent mixed plaque results in apparent severe stenosis within the proximal  right ICA. Exact quantification of stenosis is difficult due to blooming of prominent calcified plaque. Distal to this, the right ICA is asymmetrically diminutive and with asymmetrically decreased enhancement, although patent. 2. The left CCA and ICA are patent within the neck. Calcified plaque results in less than 50% stenosis of the proximal ICA. 3. The vertebral arteries are patent within the neck without stenosis. CTA head: 1. Intracranially, the right ICA remains asymmetrically diminutive and with asymmetrically decreased opacification as compared to the left. This is likely related to the high-grade right ICA stenosis within the neck. No evidence of dissection within the proximal right ICA siphon. 2. No intracranial large vessel occlusion or high-grade proximal arterial stenosis. 3. Calcified plaque within the ICA siphons bilaterally with sites of up to mild/moderate stenosis on the right and no more than mild stenosis on the left. 4. Subtle nonspecific fusiform dilation of the proximal A2 right ACA without saccular aneurysm. Electronically Signed   By: Kellie Simmering DO   On: 07/19/2019 19:04   CT Angio Neck W and/or Wo Contrast  Result Date: 07/19/2019 CLINICAL DATA:  Question dissection; head trauma, headache. EXAM: CT ANGIOGRAPHY HEAD AND NECK TECHNIQUE: Multidetector CT imaging of the head and neck was performed using the standard protocol during bolus administration of intravenous contrast. Multiplanar CT image reconstructions and MIPs were obtained to evaluate the vascular anatomy. Carotid  stenosis measurements (when applicable) are obtained utilizing NASCET criteria, using the distal internal carotid diameter as the denominator. CONTRAST:  10mL OMNIPAQUE IOHEXOL 350 MG/ML SOLN COMPARISON:  Brain MRI performed earlier the same day 07/19/2019 FINDINGS: CT HEAD FINDINGS Brain: There is no evidence of acute intracranial hemorrhage, intracranial mass, midline shift or extra-axial fluid collection.No  demarcated cortical infarction. Moderate scattered hypodensity within the cerebral white matter is nonspecific, but consistent with chronic small vessel ischemic disease. Bilateral basal ganglia calcification. Mild generalized parenchymal atrophy. Vascular: Reported separately. Skull: Normal. Negative for fracture or focal lesion. Sinuses: No significant paranasal sinus disease or mastoid effusion at the imaged levels. Orbits: Visualized orbits demonstrate no acute abnormality. Review of the MIP images confirms the above findings CTA NECK FINDINGS Aortic arch: Standard aortic branching. Mixed plaque within the visualized aortic arch and proximal major branch vessels of the neck. No significant innominate or proximal subclavian artery stenosis. Right carotid system: The CCA is patent to the bifurcation without stenosis. There is prominent mixed plaque within the proximal ICA with apparent severe stenosis. Exact quantification of stenosis is difficult due to blooming from calcified plaque. Distal to this, the right ICA is asymmetrically diminutive and with asymmetrically decreased enhancement, although patent. Left carotid system: The CCA is patent to the bifurcation without stenosis. Mild to moderate mixed plaque within the carotid bifurcation and proximal ICA. Less than 50% stenosis of the proximal ICA. Vertebral arteries: The vertebral arteries are codominant and patent within the neck without stenosis Skeleton: No acute bony abnormality. Cervical spondylosis without high-grade bony spinal canal stenosis. Other neck: No neck mass or cervical lymphadenopathy. Multiple nodules within the left thyroid lobe measuring up to 9 mm. Upper chest: No consolidation within the imaged lung apices Review of the MIP images confirms the above findings CTA HEAD FINDINGS Anterior circulation: The intracranial internal carotid arteries are patent. No evidence of dissection within the proximal right ICA siphon. Calcified plaque  results in mild to moderate stenosis within the distal cavernous/paraclinoid right ICA. Calcified plaque within the left carotid siphon with no more than mild stenosis. The middle cerebral arteries are patent without significant stenosis. No M2 proximal branch occlusion or high-grade proximal stenosis is identified. Hypoplastic A1 right ACA. The anterior cerebral arteries are patent bilaterally without high-grade proximal stenosis. Subtle nonspecific use deformed dilation of the proximal A2 right ACA. Otherwise, no intracranial aneurysm is identified. Posterior circulation: The intracranial vertebral arteries are patent without significant stenosis, as is the basilar artery. The bilateral posterior cerebral arteries are patent without significant proximal stenosis. There is a large right posterior communicating artery. A left posterior communicating artery is not definitively identified and may be hypoplastic or absent. Venous sinuses: Within limitations of contrast timing, no convincing thrombus. Anatomic variants: As described Review of the MIP images confirms the above findings IMPRESSION: CT head: 1. No evidence of acute intracranial abnormality. 2. Moderate chronic small vessel ischemic disease. CTA neck: 1. Prominent mixed plaque results in apparent severe stenosis within the proximal right ICA. Exact quantification of stenosis is difficult due to blooming of prominent calcified plaque. Distal to this, the right ICA is asymmetrically diminutive and with asymmetrically decreased enhancement, although patent. 2. The left CCA and ICA are patent within the neck. Calcified plaque results in less than 50% stenosis of the proximal ICA. 3. The vertebral arteries are patent within the neck without stenosis. CTA head: 1. Intracranially, the right ICA remains asymmetrically diminutive and with asymmetrically decreased opacification as compared to the left. This  is likely related to the high-grade right ICA stenosis  within the neck. No evidence of dissection within the proximal right ICA siphon. 2. No intracranial large vessel occlusion or high-grade proximal arterial stenosis. 3. Calcified plaque within the ICA siphons bilaterally with sites of up to mild/moderate stenosis on the right and no more than mild stenosis on the left. 4. Subtle nonspecific fusiform dilation of the proximal A2 right ACA without saccular aneurysm. Electronically Signed   By: Kellie Simmering DO   On: 07/19/2019 19:04   MR BRAIN W WO CONTRAST  Result Date: 07/19/2019 CLINICAL DATA:  Visual changes.  Fall 2 weeks ago with head injury. EXAM: MRI HEAD WITHOUT AND WITH CONTRAST TECHNIQUE: Multiplanar, multiecho pulse sequences of the brain and surrounding structures were obtained without and with intravenous contrast. CONTRAST:  7.36mL GADAVIST GADOBUTROL 1 MMOL/ML IV SOLN COMPARISON:  None. FINDINGS: Brain: Ventricle size and cerebral volume normal. Negative for acute infarct. Multiple scattered small white matter hyperintensities in the cerebral white matter as well as in the pons compatible with chronic ischemia. Negative for hemorrhage or mass. Normal enhancement following contrast infusion. No fluid collection or midline shift. Vascular: Decreased caliber with wall thickening in the right internal carotid artery in the distal cervical segment and skull base. The vessel appears patent. Otherwise normal flow voids in the left carotid and vertebrobasilar. Skull and upper cervical spine: No focal skeletal lesion. Sinuses/Orbits: Negative Other: None IMPRESSION: Moderate chronic microvascular ischemic change.  No acute infarct. Abnormal thickening of the right internal carotid artery through the skull base. 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 Also possible is a flow limiting proximal right internal carotid artery stenosis. Recommend CTA head and neck for further evaluation. These results were  called by telephone at the time of interpretation on 07/19/2019 at 3:10 pm to provider MARK MILLER , who verbally acknowledged these results. Electronically Signed   By: Franchot Gallo M.D.   On: 07/19/2019 15:11    Medications:   . aspirin  81 mg Oral Daily  . atorvastatin  80 mg Oral q1800  . carvedilol  6.25 mg Oral BID WC  . clopidogrel  75 mg Oral Daily  . heparin  5,000 Units Subcutaneous Q8H  . irbesartan  300 mg Oral Daily   And  . hydrochlorothiazide  12.5 mg Oral Daily  . insulin aspart  0-15 Units Subcutaneous TID WC  . insulin glargine  30 Units Subcutaneous Daily  . sodium chloride flush  3 mL Intravenous Q12H   Continuous Infusions: . sodium chloride       LOS: 1 day   Khushbu Pippen  [facility]     07/20/2019, 3:35 PM

## 2019-07-20 NOTE — Progress Notes (Signed)
0800 blood glucose reading 128. 1200 blood glucose reading 113.
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
## 2019-07-20 NOTE — Evaluation (Signed)
Physical Therapy Evaluation Patient Details Name: Jennifer Shelton MRN: [id number] DOB: [birth year] Today's Date: 07/20/2019   History of Present Illness  Jennifer Shelton is a 37yoF who comes to [facility] to 3/5 after fall and head trauma last week. Has had headache, right eye ptosis, and photophobia. PMH: IDDM2.  Clinical Impression  Pt admitted with above diagnosis. Pt currently with functional limitations due to the deficits listed below (see "PT Problem List"). Upon entry, pt in bed, awake and agreeable to participate. The pt is alert and oriented x4, pleasant, conversational, and generally a good historian. HA since fall and head trauma now exacerbated and 10/10. Pt attests to instability of gait since fall last week, denies dizziness, but incoordination of legs. ModI bed mobility, Supervision level transfers, supervision level gait with unsteadiness and staggering intermittently, pt stops halfway d/t worsening nausea with AMB, elects to return to room. Functional mobility assessment demonstrates increased effort/time requirements, poor tolerance, but no frank need for physical assistance, whereas the patient performed these at a higher level of independence PTA. Recommended pt be screened more in depth at DC for post-concussion symptoms, can be achieved in OPPT. Pt will benefit from skilled PT intervention to increase independence and safety with basic mobility in preparation for discharge to the venue listed below.       Follow Up Recommendations Outpatient PT(Neuro OPPT- [facility] main)    Equipment Recommendations  None recommended by PT    Recommendations for Other Services       Precautions / Restrictions Precautions Precautions: Fall Restrictions Weight Bearing Restrictions: No      Mobility  Bed Mobility Overal bed mobility: Independent                Transfers Overall transfer level: Modified independent Equipment used: None             General transfer comment:  additional time, some unsteadiness.  Ambulation/Gait Ambulation/Gait assistance: Supervision Gait Distance (Feet): 130 Feet Assistive device: None Gait Pattern/deviations: Staggering right;Drifts right/left     General Gait Details: mildly unsteady, but able to self correct without UE support or assist from author; nauseaed required to pause at 48ft, unable to continue after 45ft.  Stairs            Wheelchair Mobility    Modified Rankin (Stroke Patients Only)       Balance Overall balance assessment: Modified Independent                                           Pertinent Vitals/Pain Pain Assessment: 0-10 Pain Score: 10-Worst pain ever Pain Location: HA, bilat temporal and frontal Pain Descriptors / Indicators: Aching Pain Intervention(s): Limited activity within patient's tolerance;Monitored during session;Premedicated before session    Home Living Family/patient expects to be discharged to:: Private residence Living Arrangements: Alone Available Help at Discharge: Family Type of Home: Apartment Home Access: Level entry     Home Layout: One level Home Equipment: Cane - single point      Prior Function Level of Independence: Independent         Comments: Community dwelling adult at baseline.     Hand Dominance        Extremity/Trunk Assessment   Upper Extremity Assessment Upper Extremity Assessment: Generalized weakness;Overall [facility] for tasks assessed    Lower Extremity Assessment Lower Extremity Assessment: Generalized weakness;Overall [facility] for tasks assessed  Cervical / Trunk Assessment Cervical / Trunk Assessment: Normal  Communication      Cognition Arousal/Alertness: Awake/alert Behavior During Therapy: WFL for tasks assessed/performed Overall Cognitive Status: Within Functional Limits for tasks assessed                                        General Comments      Exercises      Assessment/Plan    PT Assessment Patient needs continued PT services  PT Problem List Decreased activity tolerance;Decreased balance;Decreased mobility       PT Treatment Interventions DME instruction;Balance training;Gait training;Functional mobility training;Therapeutic activities;Therapeutic exercise;Patient/family education    PT Goals (Current goals can be found in the Care Plan section)  Acute Rehab PT Goals Patient Stated Goal: feel better, be rid of HA PT Goal Formulation: With patient Time For Goal Achievement: 08/03/19 Potential to Achieve Goals: Good    Frequency Min 2X/week   Barriers to discharge Decreased caregiver support      Co-evaluation               AM-PAC PT "6 Clicks" Mobility  Outcome Measure Help needed turning from your back to your side while in a flat bed without using bedrails?: None Help needed moving from lying on your back to sitting on the side of a flat bed without using bedrails?: None Help needed moving to and from a bed to a chair (including a wheelchair)?: A Little Help needed standing up from a chair using your arms (e.g., wheelchair or bedside chair)?: A Little Help needed to walk in hospital room?: A Little Help needed climbing 3-5 steps with a railing? : A Little 6 Click Score: 20    End of Session   Activity Tolerance: Treatment limited secondary to medical complications (Comment)(progressive nausea with walking.) Patient left: in bed;with call bell/phone within reach Nurse Communication: Mobility status PT Visit Diagnosis: Unsteadiness on feet (R26.81);Difficulty in walking, not elsewhere classified (R26.2);Other abnormalities of gait and mobility (R26.89)    Time: [id number] PT Time Calculation (min) (ACUTE ONLY): 10 min   Charges:   PT Evaluation $PT Eval Low Complexity: 1 Low         4:38 PM, 07/20/19 Etta Grandchild, PT, DPT Physical Therapist - [facility]  [phone]  ([place])   Jennifer Shelton C 07/20/2019, 4:35 PM

## 2019-07-21 DIAGNOSIS — I6521 Occlusion and stenosis of right carotid artery: Secondary | ICD-10-CM | POA: Diagnosis not present

## 2019-07-21 DIAGNOSIS — Z87891 Personal history of nicotine dependence: Secondary | ICD-10-CM | POA: Diagnosis not present

## 2019-07-21 DIAGNOSIS — E119 Type 2 diabetes mellitus without complications: Secondary | ICD-10-CM | POA: Diagnosis not present

## 2019-07-21 DIAGNOSIS — I10 Essential (primary) hypertension: Secondary | ICD-10-CM | POA: Diagnosis not present

## 2019-07-21 DIAGNOSIS — I63231 Cerebral infarction due to unspecified occlusion or stenosis of right carotid arteries: Secondary | ICD-10-CM | POA: Diagnosis not present

## 2019-07-21 MED ORDER — CLOPIDOGREL BISULFATE 75 MG PO TABS: 75.0000 mg | ORAL_TABLET | Freq: Every day | ORAL | 0 refills | Status: DC

## 2019-07-21 MED ORDER — ASPIRIN 81 MG PO CHEW: 81.0000 mg | CHEWABLE_TABLET | Freq: Every day | ORAL | Status: DC

## 2019-07-21 MED ORDER — TRAZODONE HCL 50 MG PO TABS: 50.0000 mg | ORAL_TABLET | Freq: Every evening | ORAL | 0 refills | Status: DC | PRN

## 2019-07-21 NOTE — Discharge Summary (Signed)
Physician Discharge Summary  Jennifer Shelton [id number] DOB: 1946-04-19 DOA: 07/19/2019  PCP: Rusty Aus, MD  Admit date: 07/19/2019 Discharge date: 07/21/2019  Discharge disposition: Home   Recommendations for Outpatient Follow-Up:   Outpatient follow-up with neurologist and vascular surgeon within 1 month of discharge   Discharge Diagnosis:   Principal Problem:   Internal carotid artery stenosis, right Active Problems:   HTN (hypertension)   T2DM (type 2 diabetes mellitus) ([place])   Ptosis of eyelid, right   Hyponatremia    Discharge Condition: Stable.  Diet recommendation: Low-salt, low sugar diet  Code status: Full code.    Hospital Course:   Jennifer Shelton is an 74 y.o. female with history of hypertension, insulin-dependent diabetes mellitus, who presented to the hospital with headache after a fall.  She said that she had stepped on a chair to reach for something and she tripped and fell.  She developed severe headache, dizziness and also noticed drooping of her right eyelid.  She also reported feeling unsteady on her feet.  Work-up revealed severe right ICA stenosis.  She was seen in consultation by the neurologist, Dr. Irish Elders and the vascular surgeon, Dr. Trula Slade.  Neurologist recommended dual antiplatelet therapy with aspirin and Plavix and outpatient follow-up.  Vascular surgeon recommended outpatient follow-up and said there was  no indication for surgery at this time.  She was hyponatremic on admission but this resolved prior to discharge.  Patient said she was feeling better.  She said her headache and dizziness had improved significantly.  She says she has been able to walk with PT without any problems.  She was deemed stable for discharge to home today.  Discharge plan was discussed with her in detail.  Risk and benefits of dual antiplatelet therapy with aspirin and Plavix were discussed as well.  She was advised to discontinue meloxicam to decrease  the risk of bleeding.  Addendum   I was informed by Johnney Ou, that she left without discharge papers and left with peripheral IV in place.  Reportedly, she did not think she was ready for discharge and felt that a procedure or surgery had to be donefor the right ICA stenosis.      Discharge Exam:   Vitals:   07/20/19 2021 07/21/19 0523  BP: (!) 123/56 133/67  Pulse: 82 96  Resp: 18 16  Temp: 98.6 F (37 C) 98.2 F (36.8 C)  SpO2: 93% 95%   Vitals:   07/20/19 1147 07/20/19 1514 07/20/19 2021 07/21/19 0523  BP: (!) 129/59 (!) 149/74 (!) 123/56 133/67  Pulse: 86 77 82 96  Resp: 16  18 16   Temp: 97.9 F (36.6 C)  98.6 F (37 C) 98.2 F (36.8 C)  TempSrc: Oral  Oral Oral  SpO2: 94%  93% 95%  Weight:      Height:         GEN: NAD SKIN: No rash EYES: EOMI, mild right eye ptosis ENT: MMM CV: RRR PULM: CTA B ABD: soft, ND, NT, +BS CNS: AAO x 3, non focal EXT: No edema or tenderness   The results of significant diagnostics from this hospitalization (including imaging, microbiology, ancillary and laboratory) are listed below for reference.     Procedures and Diagnostic Studies:   CT Angio Head W or Wo Contrast  Result Date: 07/19/2019 CLINICAL DATA:  Question dissection; head trauma, headache. EXAM: CT ANGIOGRAPHY HEAD AND NECK TECHNIQUE: Multidetector CT imaging of the head and neck was performed using the standard protocol  during bolus administration of intravenous contrast. Multiplanar CT image reconstructions and MIPs were obtained to evaluate the vascular anatomy. Carotid stenosis measurements (when applicable) are obtained utilizing NASCET criteria, using the distal internal carotid diameter as the denominator. CONTRAST:  77mL OMNIPAQUE IOHEXOL 350 MG/ML SOLN COMPARISON:  Brain MRI performed earlier the same day 07/19/2019 FINDINGS: CT HEAD FINDINGS Brain: There is no evidence of acute intracranial hemorrhage, intracranial mass, midline shift or extra-axial fluid  collection.No demarcated cortical infarction. Moderate scattered hypodensity within the cerebral white matter is nonspecific, but consistent with chronic small vessel ischemic disease. Bilateral basal ganglia calcification. Mild generalized parenchymal atrophy. Vascular: Reported separately. Skull: Normal. Negative for fracture or focal lesion. Sinuses: No significant paranasal sinus disease or mastoid effusion at the imaged levels. Orbits: Visualized orbits demonstrate no acute abnormality. Review of the MIP images confirms the above findings CTA NECK FINDINGS Aortic arch: Standard aortic branching. Mixed plaque within the visualized aortic arch and proximal major branch vessels of the neck. No significant innominate or proximal subclavian artery stenosis. Right carotid system: The CCA is patent to the bifurcation without stenosis. There is prominent mixed plaque within the proximal ICA with apparent severe stenosis. Exact quantification of stenosis is difficult due to blooming from calcified plaque. Distal to this, the right ICA is asymmetrically diminutive and with asymmetrically decreased enhancement, although patent. Left carotid system: The CCA is patent to the bifurcation without stenosis. Mild to moderate mixed plaque within the carotid bifurcation and proximal ICA. Less than 50% stenosis of the proximal ICA. Vertebral arteries: The vertebral arteries are codominant and patent within the neck without stenosis Skeleton: No acute bony abnormality. Cervical spondylosis without high-grade bony spinal canal stenosis. Other neck: No neck mass or cervical lymphadenopathy. Multiple nodules within the left thyroid lobe measuring up to 9 mm. Upper chest: No consolidation within the imaged lung apices Review of the MIP images confirms the above findings CTA HEAD FINDINGS Anterior circulation: The intracranial internal carotid arteries are patent. No evidence of dissection within the proximal right ICA siphon. Calcified  plaque results in mild to moderate stenosis within the distal cavernous/paraclinoid right ICA. Calcified plaque within the left carotid siphon with no more than mild stenosis. The middle cerebral arteries are patent without significant stenosis. No M2 proximal branch occlusion or high-grade proximal stenosis is identified. Hypoplastic A1 right ACA. The anterior cerebral arteries are patent bilaterally without high-grade proximal stenosis. Subtle nonspecific use deformed dilation of the proximal A2 right ACA. Otherwise, no intracranial aneurysm is identified. Posterior circulation: The intracranial vertebral arteries are patent without significant stenosis, as is the basilar artery. The bilateral posterior cerebral arteries are patent without significant proximal stenosis. There is a large right posterior communicating artery. A left posterior communicating artery is not definitively identified and may be hypoplastic or absent. Venous sinuses: Within limitations of contrast timing, no convincing thrombus. Anatomic variants: As described Review of the MIP images confirms the above findings IMPRESSION: CT head: 1. No evidence of acute intracranial abnormality. 2. Moderate chronic small vessel ischemic disease. CTA neck: 1. Prominent mixed plaque results in apparent severe stenosis within the proximal right ICA. Exact quantification of stenosis is difficult due to blooming of prominent calcified plaque. Distal to this, the right ICA is asymmetrically diminutive and with asymmetrically decreased enhancement, although patent. 2. The left CCA and ICA are patent within the neck. Calcified plaque results in less than 50% stenosis of the proximal ICA. 3. The vertebral arteries are patent within the neck without stenosis. CTA  head: 1. Intracranially, the right ICA remains asymmetrically diminutive and with asymmetrically decreased opacification as compared to the left. This is likely related to the high-grade right ICA  stenosis within the neck. No evidence of dissection within the proximal right ICA siphon. 2. No intracranial large vessel occlusion or high-grade proximal arterial stenosis. 3. Calcified plaque within the ICA siphons bilaterally with sites of up to mild/moderate stenosis on the right and no more than mild stenosis on the left. 4. Subtle nonspecific fusiform dilation of the proximal A2 right ACA without saccular aneurysm. Electronically Signed   By: Kellie Simmering DO   On: 07/19/2019 19:04   CT Angio Neck W and/or Wo Contrast  Result Date: 07/19/2019 CLINICAL DATA:  Question dissection; head trauma, headache. EXAM: CT ANGIOGRAPHY HEAD AND NECK TECHNIQUE: Multidetector CT imaging of the head and neck was performed using the standard protocol during bolus administration of intravenous contrast. Multiplanar CT image reconstructions and MIPs were obtained to evaluate the vascular anatomy. Carotid stenosis measurements (when applicable) are obtained utilizing NASCET criteria, using the distal internal carotid diameter as the denominator. CONTRAST:  20mL OMNIPAQUE IOHEXOL 350 MG/ML SOLN COMPARISON:  Brain MRI performed earlier the same day 07/19/2019 FINDINGS: CT HEAD FINDINGS Brain: There is no evidence of acute intracranial hemorrhage, intracranial mass, midline shift or extra-axial fluid collection.No demarcated cortical infarction. Moderate scattered hypodensity within the cerebral white matter is nonspecific, but consistent with chronic small vessel ischemic disease. Bilateral basal ganglia calcification. Mild generalized parenchymal atrophy. Vascular: Reported separately. Skull: Normal. Negative for fracture or focal lesion. Sinuses: No significant paranasal sinus disease or mastoid effusion at the imaged levels. Orbits: Visualized orbits demonstrate no acute abnormality. Review of the MIP images confirms the above findings CTA NECK FINDINGS Aortic arch: Standard aortic branching. Mixed plaque within the visualized  aortic arch and proximal major branch vessels of the neck. No significant innominate or proximal subclavian artery stenosis. Right carotid system: The CCA is patent to the bifurcation without stenosis. There is prominent mixed plaque within the proximal ICA with apparent severe stenosis. Exact quantification of stenosis is difficult due to blooming from calcified plaque. Distal to this, the right ICA is asymmetrically diminutive and with asymmetrically decreased enhancement, although patent. Left carotid system: The CCA is patent to the bifurcation without stenosis. Mild to moderate mixed plaque within the carotid bifurcation and proximal ICA. Less than 50% stenosis of the proximal ICA. Vertebral arteries: The vertebral arteries are codominant and patent within the neck without stenosis Skeleton: No acute bony abnormality. Cervical spondylosis without high-grade bony spinal canal stenosis. Other neck: No neck mass or cervical lymphadenopathy. Multiple nodules within the left thyroid lobe measuring up to 9 mm. Upper chest: No consolidation within the imaged lung apices Review of the MIP images confirms the above findings CTA HEAD FINDINGS Anterior circulation: The intracranial internal carotid arteries are patent. No evidence of dissection within the proximal right ICA siphon. Calcified plaque results in mild to moderate stenosis within the distal cavernous/paraclinoid right ICA. Calcified plaque within the left carotid siphon with no more than mild stenosis. The middle cerebral arteries are patent without significant stenosis. No M2 proximal branch occlusion or high-grade proximal stenosis is identified. Hypoplastic A1 right ACA. The anterior cerebral arteries are patent bilaterally without high-grade proximal stenosis. Subtle nonspecific use deformed dilation of the proximal A2 right ACA. Otherwise, no intracranial aneurysm is identified. Posterior circulation: The intracranial vertebral arteries are patent without  significant stenosis, as is the basilar artery. The bilateral posterior  cerebral arteries are patent without significant proximal stenosis. There is a large right posterior communicating artery. A left posterior communicating artery is not definitively identified and may be hypoplastic or absent. Venous sinuses: Within limitations of contrast timing, no convincing thrombus. Anatomic variants: As described Review of the MIP images confirms the above findings IMPRESSION: CT head: 1. No evidence of acute intracranial abnormality. 2. Moderate chronic small vessel ischemic disease. CTA neck: 1. Prominent mixed plaque results in apparent severe stenosis within the proximal right ICA. Exact quantification of stenosis is difficult due to blooming of prominent calcified plaque. Distal to this, the right ICA is asymmetrically diminutive and with asymmetrically decreased enhancement, although patent. 2. The left CCA and ICA are patent within the neck. Calcified plaque results in less than 50% stenosis of the proximal ICA. 3. The vertebral arteries are patent within the neck without stenosis. CTA head: 1. Intracranially, the right ICA remains asymmetrically diminutive and with asymmetrically decreased opacification as compared to the left. This is likely related to the high-grade right ICA stenosis within the neck. No evidence of dissection within the proximal right ICA siphon. 2. No intracranial large vessel occlusion or high-grade proximal arterial stenosis. 3. Calcified plaque within the ICA siphons bilaterally with sites of up to mild/moderate stenosis on the right and no more than mild stenosis on the left. 4. Subtle nonspecific fusiform dilation of the proximal A2 right ACA without saccular aneurysm. Electronically Signed   By: Kellie Simmering DO   On: 07/19/2019 19:04   MR BRAIN W WO CONTRAST  Result Date: 07/19/2019 CLINICAL DATA:  Visual changes.  Fall 2 weeks ago with head injury. EXAM: MRI HEAD WITHOUT AND WITH  CONTRAST TECHNIQUE: Multiplanar, multiecho pulse sequences of the brain and surrounding structures were obtained without and with intravenous contrast. CONTRAST:  7.35mL GADAVIST GADOBUTROL 1 MMOL/ML IV SOLN COMPARISON:  None. FINDINGS: Brain: Ventricle size and cerebral volume normal. Negative for acute infarct. Multiple scattered small white matter hyperintensities in the cerebral white matter as well as in the pons compatible with chronic ischemia. Negative for hemorrhage or mass. Normal enhancement following contrast infusion. No fluid collection or midline shift. Vascular: Decreased caliber with wall thickening in the right internal carotid artery in the distal cervical segment and skull base. The vessel appears patent. Otherwise normal flow voids in the left carotid and vertebrobasilar. Skull and upper cervical spine: No focal skeletal lesion. Sinuses/Orbits: Negative Other: None IMPRESSION: Moderate chronic microvascular ischemic change.  No acute infarct. Abnormal thickening of the right internal carotid artery through the skull base. 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 Also possible is a flow limiting proximal right internal carotid artery stenosis. Recommend CTA head and neck for further evaluation. These results were called by telephone at the time of interpretation on 07/19/2019 at 3:10 pm to provider MARK MILLER , who verbally acknowledged these results. Electronically Signed   By: Franchot Gallo M.D.   On: 07/19/2019 15:11     Labs:   Basic Metabolic Panel: Recent Labs  Lab 07/19/19 1716 07/20/19 0509  NA 132* 137  K 4.6 3.7  CL 99 98  CO2 25 27  GLUCOSE 86 146*  BUN 17 18  CREATININE 0.74 0.68  CALCIUM 9.5 9.1   GFR Estimated Creatinine Clearance: 62.4 mL/min (by C-G formula based on SCr of 0.68 mg/dL). Liver Function Tests: No results for input(s): AST, ALT, ALKPHOS, BILITOT, PROT, ALBUMIN in the last 168 hours.  No  results for input(s): LIPASE, AMYLASE in the last 168 hours. No results for input(s): AMMONIA in the last 168 hours. Coagulation profile Recent Labs  Lab 07/19/19 1716  INR 0.9    CBC: Recent Labs  Lab 07/19/19 1716 07/20/19 0509  WBC 9.8 9.5  HGB 16.3* 15.1*  HCT 46.1* 44.0  MCV 84.3 86.6  PLT 250 243   Cardiac Enzymes: No results for input(s): CKTOTAL, CKMB, CKMBINDEX, TROPONINI in the last 168 hours. BNP: Invalid input(s): POCBNP CBG: Recent Labs  Lab 07/19/19 1900 07/19/19 2026  GLUCAP 56* 153*   D-Dimer No results for input(s): DDIMER in the last 72 hours. Hgb A1c Recent Labs    07/19/19 1716  HGBA1C 6.3*   Lipid Profile No results for input(s): CHOL, HDL, LDLCALC, TRIG, CHOLHDL, LDLDIRECT in the last 72 hours. Thyroid function studies No results for input(s): TSH, T4TOTAL, T3FREE, THYROIDAB in the last 72 hours.  Invalid input(s): FREET3 Anemia work up No results for input(s): VITAMINB12, FOLATE, FERRITIN, TIBC, IRON, RETICCTPCT in the last 72 hours. Microbiology Recent Results (from the past 240 hour(s))  SARS CORONAVIRUS 2 (TAT 6-24 HRS) Nasopharyngeal Nasopharyngeal Swab     Status: None   Collection Time: 07/19/19  5:16 PM   Specimen: Nasopharyngeal Swab  Result Value Ref Range Status   SARS Coronavirus 2 NEGATIVE NEGATIVE Final    Comment: (NOTE) SARS-CoV-2 target nucleic acids are NOT DETECTED. The SARS-CoV-2 RNA is generally detectable in upper and lower respiratory specimens during the acute phase of infection. Negative results do not preclude SARS-CoV-2 infection, do not rule out co-infections with other pathogens, and should not be used as the sole basis for treatment or other patient management decisions. Negative results must be combined with clinical observations, patient history, and epidemiological information. The expected result is Negative. Fact Sheet for Patients: SugarRoll.be Fact Sheet for  Healthcare Providers: https://www.woods-mathews.com/ This test is not yet approved or cleared by the [place] FDA and  has been authorized for detection and/or diagnosis of SARS-CoV-2 by FDA under an Emergency Use Authorization (EUA). This EUA will remain  in effect (meaning this test can be used) for the duration of the COVID-19 declaration under Section 56 4(b)(1) of the Act, 21 U.S.C. section 360bbb-3(b)(1), unless the authorization is terminated or revoked sooner. Performed at [facility], [place] [address], [place], [place] [address]      Discharge Instructions:   Discharge Instructions    Diet - low sodium heart healthy   Complete by: As directed    Diet Carb Modified   Complete by: As directed    Increase activity slowly   Complete by: As directed      Allergies as of 07/21/2019      Reactions   Metformin And Related Diarrhea, Nausea And Vomiting      Medication List    STOP taking these medications   meloxicam 7.5 MG tablet Commonly known as: MOBIC     TAKE these medications   albuterol 108 (90 Base) MCG/ACT inhaler Commonly known as: VENTOLIN HFA Inhale 1-2 puffs into the lungs every 6 (six) hours as needed for wheezing or shortness of breath.   ascorbic acid 1000 MG tablet Commonly known as: VITAMIN C Take 1,000 mg by mouth daily.   aspirin 81 MG chewable tablet Chew 1 tablet (81 mg total) by mouth daily. Start taking on: July 22, 2019   b complex vitamins tablet Take 1 tablet by mouth daily.   carvedilol 6.25 MG  tablet Commonly known as: COREG Take 6.25 mg by mouth 2 (two) times daily with a meal.   cholecalciferol 25 MCG (1000 UNIT) tablet Commonly known as: VITAMIN D Take 1,000 Units by mouth daily.   clopidogrel 75 MG tablet Commonly known as: PLAVIX Take 1 tablet (75 mg total) by mouth daily. Start taking on: July 22, 2019   FLUoxetine 20 MG capsule Commonly known as: PROZAC Take 20 mg by mouth 3 (three) times  daily.   glimepiride 2 MG tablet Commonly known as: AMARYL Take 2 mg by mouth daily with breakfast.   Lantus 100 UNIT/ML injection Generic drug: insulin glargine Inject 42 Units into the skin at bedtime.   methylphenidate 10 MG tablet Commonly known as: RITALIN Take 10 mg by mouth 2 (two) times daily.   olmesartan-hydrochlorothiazide 40-12.5 MG tablet Commonly known as: BENICAR HCT Take 1 tablet by mouth daily.   simvastatin 40 MG tablet Commonly known as: ZOCOR Take 40 mg by mouth at bedtime.   traZODone 50 MG tablet Commonly known as: DESYREL Take 1 tablet (50 mg total) by mouth at bedtime as needed for up to 7 days for sleep.      Follow-up Information    [facility]. Schedule an appointment as soon as possible for a visit in 1 month(s).   Contact information: [address] [place]       Serafina Mitchell, MD. Schedule an appointment as soon as possible for a visit in 1 month(s).   Specialties: Vascular Surgery, Cardiology Contact information: [address] [place] [id number] [phone]            Time coordinating discharge: 28 minutes  Signed:  Jennye Boroughs  [facility] 07/21/2019, 10:03 AM

## 2019-07-21 NOTE — Progress Notes (Signed)
Doctor placed discharge order for patient, this nurse printed all the required paper work and upon entering patients room, with nurse tech, patient was not present.  Bathroom checked as well and all belongings were  gone.  This nurse contacted the patient who stated,  "The doctor told me I could go home, I got dressed, waited a few minutes, and called my son to pick me up."  I enquired if the patient could come back to receive her discharge paperwork and have PIV to the left lower arm removed.  The patient stated, "I am already in the car and headed to another hospital.  My son was upset that I was discharged.  Thank you for everything, I appreciate it."  Then the patient ended the call.  Charge nurse updated on current situation and Dr. Mal Misty notified that patient left without discharge paperwork and PIV still in place.  Charge nurse notified AC.

## 2019-07-21 NOTE — Plan of Care (Signed)
Continuing with plan of care. 

## 2019-07-22 DIAGNOSIS — I739 Peripheral vascular disease, unspecified: Secondary | ICD-10-CM | POA: Diagnosis not present

## 2019-07-22 DIAGNOSIS — Z8249 Family history of ischemic heart disease and other diseases of the circulatory system: Secondary | ICD-10-CM | POA: Diagnosis not present

## 2019-07-22 DIAGNOSIS — I6521 Occlusion and stenosis of right carotid artery: Secondary | ICD-10-CM | POA: Diagnosis not present

## 2019-07-22 LAB — GLUCOSE, CAPILLARY
Glucose-Capillary: 128 mg/dL — ABNORMAL HIGH (ref 70–99)
Glucose-Capillary: 113 mg/dL — ABNORMAL HIGH (ref 70–99)
Glucose-Capillary: 128 mg/dL — ABNORMAL HIGH (ref 70–99)
Glucose-Capillary: 155 mg/dL — ABNORMAL HIGH (ref 70–99)
Glucose-Capillary: 148 mg/dL — ABNORMAL HIGH (ref 70–99)

## 2019-07-26 DIAGNOSIS — I6521 Occlusion and stenosis of right carotid artery: Secondary | ICD-10-CM | POA: Diagnosis not present

## 2019-07-26 DIAGNOSIS — I10 Essential (primary) hypertension: Secondary | ICD-10-CM | POA: Diagnosis not present

## 2019-07-26 DIAGNOSIS — E1159 Type 2 diabetes mellitus with other circulatory complications: Secondary | ICD-10-CM | POA: Diagnosis not present

## 2019-07-26 DIAGNOSIS — Z794 Long term (current) use of insulin: Secondary | ICD-10-CM | POA: Diagnosis not present

## 2019-07-26 DIAGNOSIS — E782 Mixed hyperlipidemia: Secondary | ICD-10-CM | POA: Diagnosis not present

## 2019-07-26 DIAGNOSIS — I739 Peripheral vascular disease, unspecified: Secondary | ICD-10-CM | POA: Diagnosis not present

## 2019-08-05 DIAGNOSIS — Z01818 Encounter for other preprocedural examination: Secondary | ICD-10-CM | POA: Diagnosis not present

## 2019-08-05 DIAGNOSIS — G7000 Myasthenia gravis without (acute) exacerbation: Secondary | ICD-10-CM | POA: Diagnosis not present

## 2019-08-05 DIAGNOSIS — I6521 Occlusion and stenosis of right carotid artery: Secondary | ICD-10-CM | POA: Diagnosis not present

## 2019-08-05 DIAGNOSIS — Z87891 Personal history of nicotine dependence: Secondary | ICD-10-CM | POA: Diagnosis not present

## 2019-08-05 DIAGNOSIS — Z794 Long term (current) use of insulin: Secondary | ICD-10-CM | POA: Diagnosis not present

## 2019-08-05 DIAGNOSIS — E119 Type 2 diabetes mellitus without complications: Secondary | ICD-10-CM | POA: Diagnosis not present

## 2019-08-06 DIAGNOSIS — G7000 Myasthenia gravis without (acute) exacerbation: Secondary | ICD-10-CM | POA: Diagnosis not present

## 2019-08-06 DIAGNOSIS — Z8739 Personal history of other diseases of the musculoskeletal system and connective tissue: Secondary | ICD-10-CM | POA: Diagnosis not present

## 2019-08-06 DIAGNOSIS — I6521 Occlusion and stenosis of right carotid artery: Secondary | ICD-10-CM | POA: Diagnosis not present

## 2019-08-08 DIAGNOSIS — Z87891 Personal history of nicotine dependence: Secondary | ICD-10-CM | POA: Diagnosis not present

## 2019-08-08 DIAGNOSIS — I6523 Occlusion and stenosis of bilateral carotid arteries: Secondary | ICD-10-CM | POA: Diagnosis not present

## 2019-08-08 DIAGNOSIS — I6521 Occlusion and stenosis of right carotid artery: Secondary | ICD-10-CM | POA: Diagnosis not present

## 2019-08-08 DIAGNOSIS — I10 Essential (primary) hypertension: Secondary | ICD-10-CM | POA: Diagnosis not present

## 2019-08-08 DIAGNOSIS — I70213 Atherosclerosis of native arteries of extremities with intermittent claudication, bilateral legs: Secondary | ICD-10-CM | POA: Diagnosis not present

## 2019-08-08 DIAGNOSIS — E782 Mixed hyperlipidemia: Secondary | ICD-10-CM | POA: Diagnosis not present

## 2019-08-08 DIAGNOSIS — Z7982 Long term (current) use of aspirin: Secondary | ICD-10-CM | POA: Diagnosis not present

## 2019-08-08 DIAGNOSIS — Z7902 Long term (current) use of antithrombotics/antiplatelets: Secondary | ICD-10-CM | POA: Diagnosis not present

## 2019-08-08 DIAGNOSIS — E1151 Type 2 diabetes mellitus with diabetic peripheral angiopathy without gangrene: Secondary | ICD-10-CM | POA: Diagnosis not present

## 2019-08-08 DIAGNOSIS — Z7984 Long term (current) use of oral hypoglycemic drugs: Secondary | ICD-10-CM | POA: Diagnosis not present

## 2019-08-08 HISTORY — PX: OTHER SURGICAL HISTORY: SHX169

## 2019-09-02 DIAGNOSIS — Z794 Long term (current) use of insulin: Secondary | ICD-10-CM | POA: Diagnosis not present

## 2019-09-02 DIAGNOSIS — M5136 Other intervertebral disc degeneration, lumbar region: Secondary | ICD-10-CM | POA: Diagnosis not present

## 2019-09-02 DIAGNOSIS — E785 Hyperlipidemia, unspecified: Secondary | ICD-10-CM | POA: Diagnosis not present

## 2019-09-02 DIAGNOSIS — N183 Chronic kidney disease, stage 3 unspecified: Secondary | ICD-10-CM | POA: Diagnosis not present

## 2019-09-02 DIAGNOSIS — E1122 Type 2 diabetes mellitus with diabetic chronic kidney disease: Secondary | ICD-10-CM | POA: Diagnosis not present

## 2019-09-02 DIAGNOSIS — Z79899 Other long term (current) drug therapy: Secondary | ICD-10-CM | POA: Diagnosis not present

## 2019-09-02 DIAGNOSIS — E1151 Type 2 diabetes mellitus with diabetic peripheral angiopathy without gangrene: Secondary | ICD-10-CM | POA: Diagnosis not present

## 2019-09-08 DIAGNOSIS — Z01818 Encounter for other preprocedural examination: Secondary | ICD-10-CM | POA: Diagnosis not present

## 2019-09-08 DIAGNOSIS — I6521 Occlusion and stenosis of right carotid artery: Secondary | ICD-10-CM | POA: Diagnosis not present

## 2019-09-11 DIAGNOSIS — I6521 Occlusion and stenosis of right carotid artery: Secondary | ICD-10-CM | POA: Diagnosis not present

## 2019-09-11 DIAGNOSIS — Z006 Encounter for examination for normal comparison and control in clinical research program: Secondary | ICD-10-CM | POA: Diagnosis not present

## 2019-09-11 DIAGNOSIS — R9431 Abnormal electrocardiogram [ECG] [EKG]: Secondary | ICD-10-CM | POA: Diagnosis not present

## 2019-09-11 DIAGNOSIS — I10 Essential (primary) hypertension: Secondary | ICD-10-CM | POA: Diagnosis not present

## 2019-09-11 DIAGNOSIS — Z87891 Personal history of nicotine dependence: Secondary | ICD-10-CM | POA: Diagnosis not present

## 2019-09-11 DIAGNOSIS — I70212 Atherosclerosis of native arteries of extremities with intermittent claudication, left leg: Secondary | ICD-10-CM | POA: Diagnosis not present

## 2019-09-11 DIAGNOSIS — E782 Mixed hyperlipidemia: Secondary | ICD-10-CM | POA: Diagnosis not present

## 2019-09-11 DIAGNOSIS — H538 Other visual disturbances: Secondary | ICD-10-CM | POA: Diagnosis not present

## 2019-09-11 DIAGNOSIS — E119 Type 2 diabetes mellitus without complications: Secondary | ICD-10-CM | POA: Diagnosis not present

## 2019-09-11 DIAGNOSIS — E1151 Type 2 diabetes mellitus with diabetic peripheral angiopathy without gangrene: Secondary | ICD-10-CM | POA: Diagnosis not present

## 2019-09-11 HISTORY — PX: OTHER SURGICAL HISTORY: SHX169

## 2019-10-15 ENCOUNTER — Other Ambulatory Visit: Payer: Self-pay | Admitting: Neurology

## 2019-10-15 DIAGNOSIS — D4989 Neoplasm of unspecified behavior of other specified sites: Secondary | ICD-10-CM

## 2019-10-15 DIAGNOSIS — G7000 Myasthenia gravis without (acute) exacerbation: Secondary | ICD-10-CM

## 2019-10-29 ENCOUNTER — Other Ambulatory Visit: Payer: Self-pay

## 2019-10-29 ENCOUNTER — Ambulatory Visit
Admission: RE | Admit: 2019-10-29 | Discharge: 2019-10-29 | Disposition: A | Discharge status: Home / Self Care | Payer: Medicare Other | Source: Ambulatory Visit | Attending: Neurology | Admitting: Neurology

## 2019-10-29 DIAGNOSIS — G7000 Myasthenia gravis without (acute) exacerbation: Secondary | ICD-10-CM | POA: Diagnosis present

## 2019-10-29 DIAGNOSIS — D4989 Neoplasm of unspecified behavior of other specified sites: Secondary | ICD-10-CM

## 2019-10-29 LAB — POCT I-STAT CREATININE: Creatinine, Ser: 0.9 mg/dL (ref 0.44–1.00)

## 2019-10-29 IMAGING — CT CT CHEST W/ CM
2 of 3 series · 14 of 36 positions shown, 17 images · IV contrast (omnipaque)
Comparison: Chest radiograph [DATE]

CLINICAL DATA: Ocular myasthenia gravis

EXAM:
CT CHEST WITH CONTRAST
TECHNIQUE: Multidetector CT imaging of the chest was performed during
intravenous contrast administration.
CONTRAST:  75mL OMNIPAQUE IOHEXOL 300 MG/ML  SOLN

[Series 2: axial st · axial · 0.67mm/px · z∈[-304,-46]mm · 11 of 153 slices shown, 14 images]
[im 12/153  mediastinal]
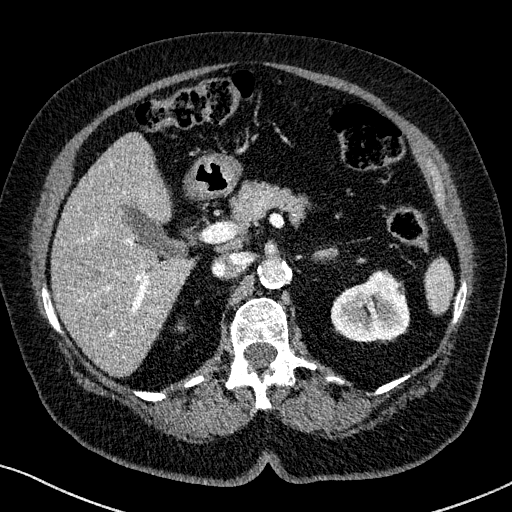
[im 12/153  lung]
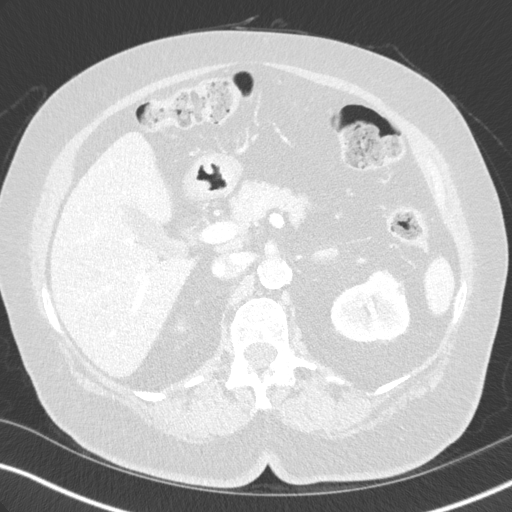
[im 23/153  lung]
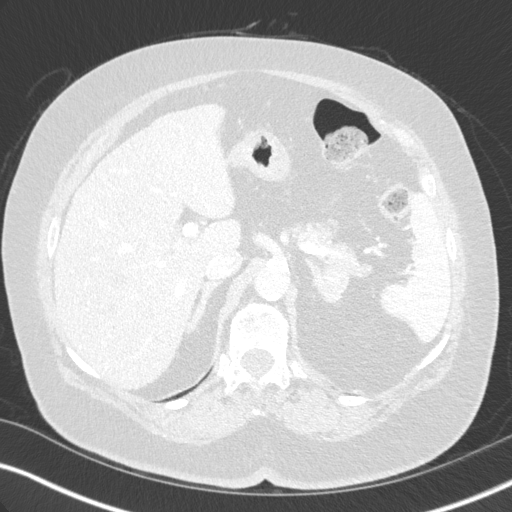
[im 34/153  lung]
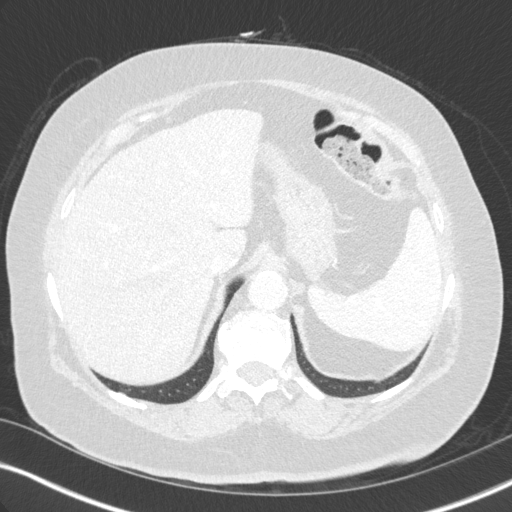
[im 51/153  lung]
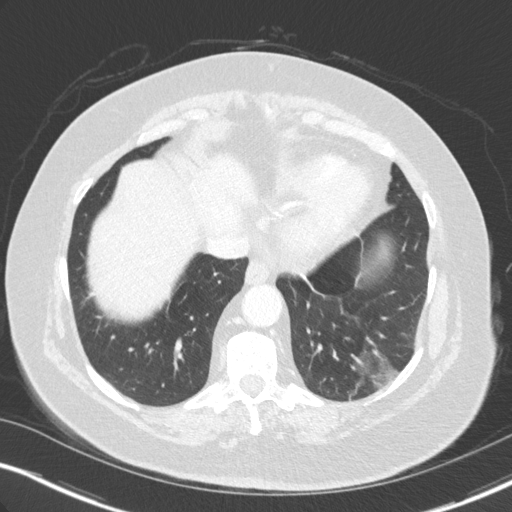
[im 62/153  mediastinal]
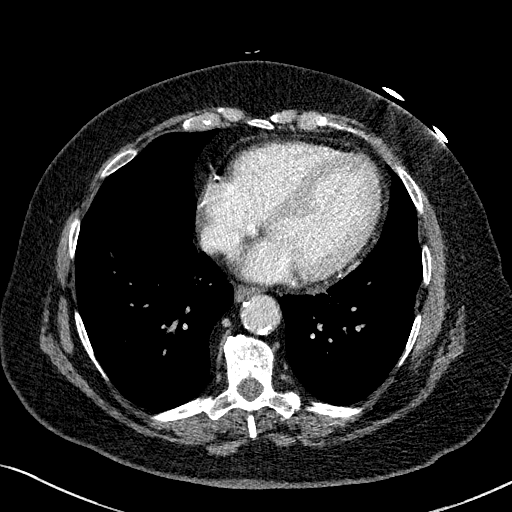
[im 62/153  lung]
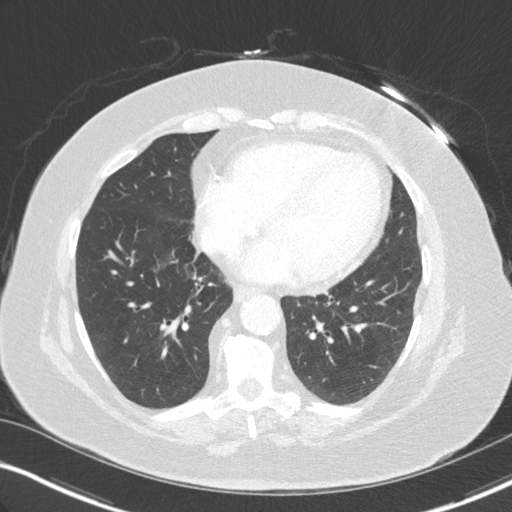
[im 79/153  lung]
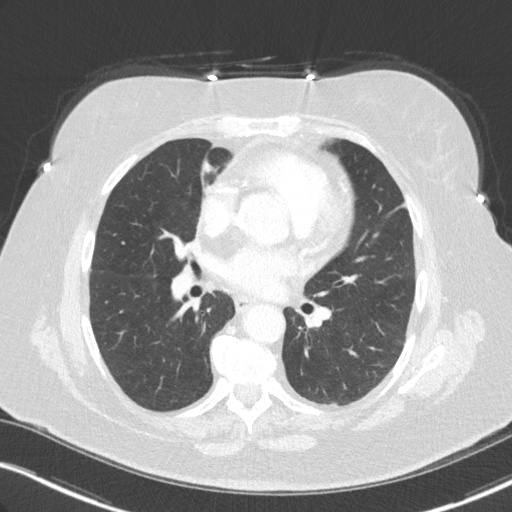
[im 91/153  lung]
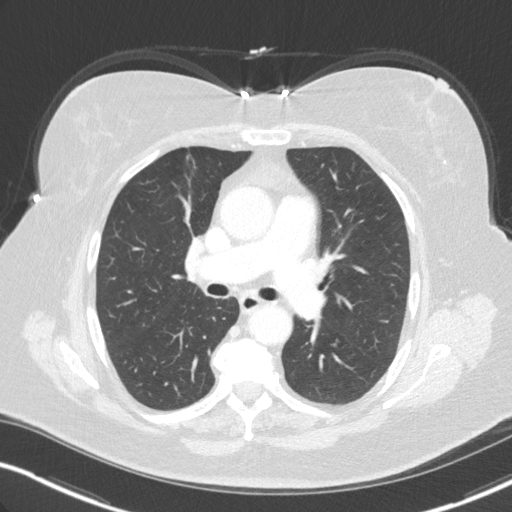
[im 102/153  lung]
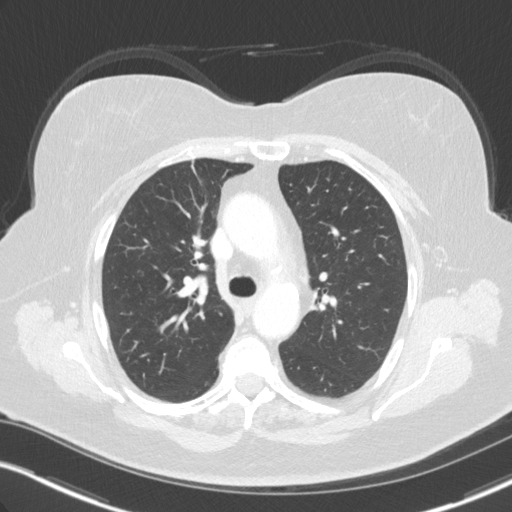
[im 119/153  mediastinal]
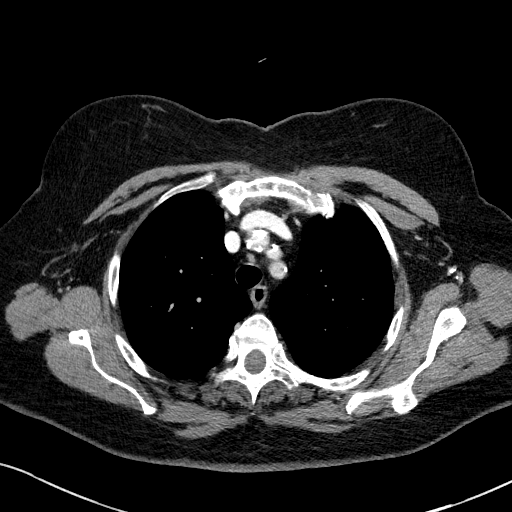
[im 119/153  lung]
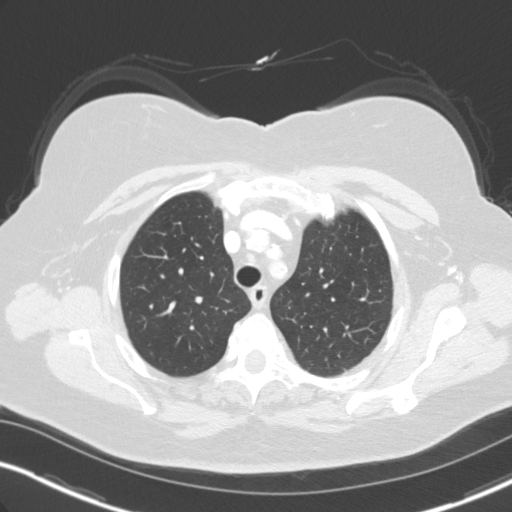
[im 130/153  lung]
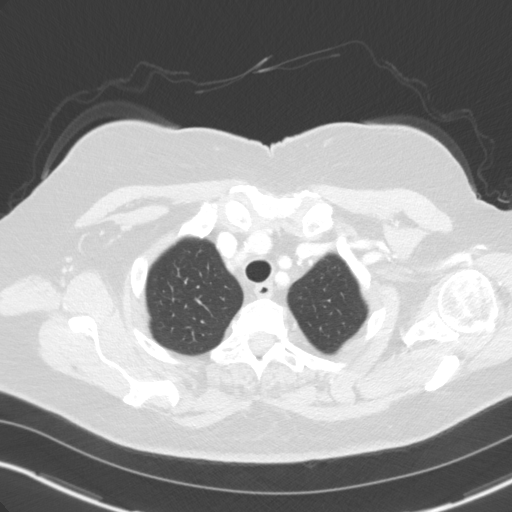
[im 141/153  lung]
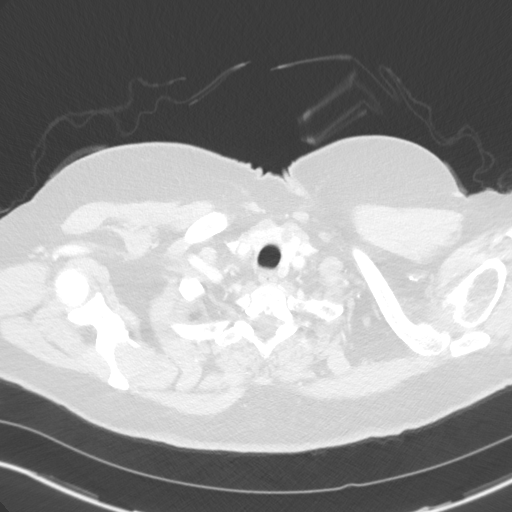

[Series 5: coronal · coronal · 0.61mm/px · 3 of 147 slices shown]
[im 30/147  lung]
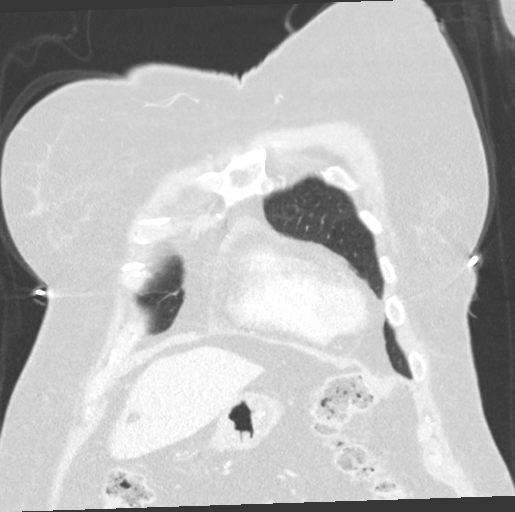
[im 59/147  lung]
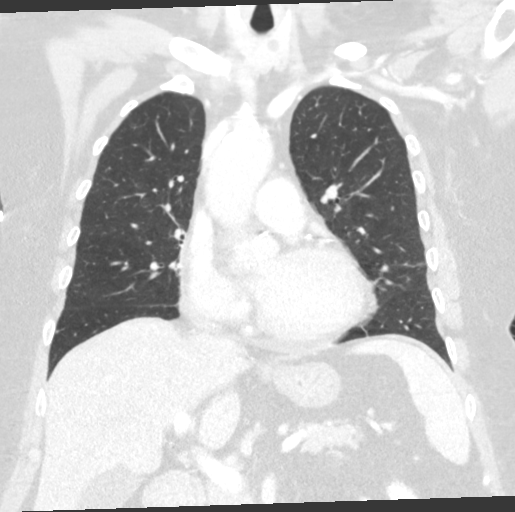
[im 88/147  lung]
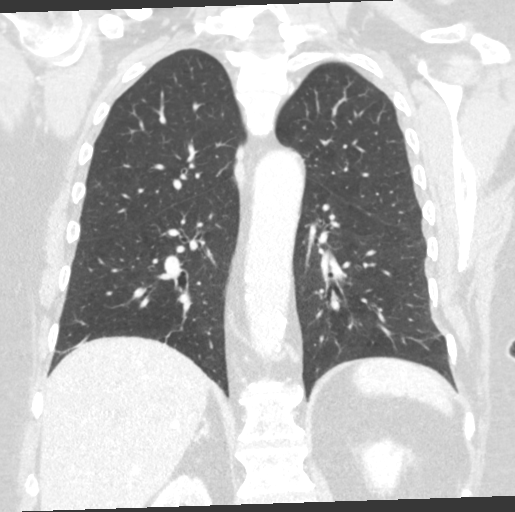

[14 of 36 positions shown; findings below may reference images not displayed]

FINDINGS: Cardiovascular: Normal heart size. No pericardial effusion.
Three-vessel coronary artery calcifications are present. Few
calcifications present on the aortic leaflets as well. Trace
pericardial effusion is within normal limits. Atherosclerotic plaque
within the normal caliber aorta. No acute luminal or periaortic
abnormality. Normal 3 vessel branching of the aortic arch with
atheromatous plaque in the otherwise normal great vessels. Central
pulmonary arteries are normal caliber without large central or lobar
filling defects on this non tailored examination.

Mediastinum/Nodes: No mediastinal fluid or gas. There are several
scattered heterogeneous nodules throughout the thyroid gland
including the largest a 6 mm hypoattenuating nodule in the anterior
left lobe ([DATE]). No concerning masses at the thoracic inlet or
mediastinum. No acute abnormality of the trachea or esophagus. No
worrisome mediastinal, hilar or axillary adenopathy.

Lungs/Pleura: Bandlike areas of opacity in the right middle, lingula
and both lower lobes likely reflecting areas of scarring or
atelectasis. No consolidation, features of edema, pneumothorax, or
effusion. Small branching nodules present in the superior segment
left lower lobe (3/62) measuring up to 6 mm in size.

Upper Abdomen: Diffuse hepatic hypoattenuation compatible with
hepatic steatosis. Scattered subcentimeter hypodense foci in both
kidneys too small to fully characterize on CT imaging but
statistically likely benign. Indeterminate 2.4 cm nodule in the
lateral limb left adrenal gland (2/134) incompletely characterized
on this exam. No acute abnormalities present in the visualized
portions of the upper abdomen.

Musculoskeletal: Multilevel degenerative changes are present in the
imaged portions of the spine. No acute osseous abnormality or
suspicious osseous lesion. Some bridging anterior osteophytosis
noted in the T7-T10 levels could reflect early features of diffuse
idiopathic skeletal hyperostosis.
IMPRESSION: 1. No large anterior mediastinal lesions to suggest the presence of
a thymoma.
2. Few subcentimeter hypoattenuating and hyperattenuating nodules in
the thyroid gland. Per typical imaging criteria no further
evaluation is warranted. This follows consensus guidelines: Managing
Incidental Thyroid Nodules Detected on Imaging: White Paper of [REDACTED]. [HOSPITAL] [P0];
[DATE]. and HMOP 3-tiered system for managing ITNs: [HOSPITAL]. [DATE]): 143-50.
3. Indeterminate 2.4 cm nodule in the lateral limb left adrenal
gland. Consider further evaluation with adrenal protocol MRI or
multiphase CT. This recommendation follows ACR consensus guidelines:
Management of Incidental Adrenal Masses: A White Paper of the ACR
Incidental Findings Committee. [HOSPITAL] [P0];14:[PHONE_NUMBER].
4. Small branching nodule in the superior segment left lower lobe
measuring up to 6 mm in size. These are nonspecific, but can be seen
in the setting of infectious/inflammatory bronchiolitis however a
Non-contrast chest CT at 6-12 months is recommended. If the nodule
is stable at time of repeat CT, then future CT at 18-24 months (from
today's scan) is considered optional for low-risk patients, but is
recommended for high-risk patients. This recommendation follows the
consensus statement: Guidelines for Management of Incidental
Pulmonary Nodules Detected on CT Images: From the [HOSPITAL]
5. Hepatic steatosis.
6. Aortic Atherosclerosis ([P0]-[P0]).

## 2019-10-29 MED ORDER — IOHEXOL 300 MG/ML  SOLN
75.0000 mL | Freq: Once | INTRAMUSCULAR | Status: AC | PRN
  Administered 2019-10-29: 75 mL via INTRAVENOUS

## 2019-12-19 HISTORY — PX: OTHER SURGICAL HISTORY: SHX169

## 2019-12-31 ENCOUNTER — Ambulatory Visit (INDEPENDENT_AMBULATORY_CARE_PROVIDER_SITE_OTHER): Payer: Medicare Other | Admitting: Dermatology

## 2019-12-31 ENCOUNTER — Other Ambulatory Visit: Payer: Self-pay

## 2019-12-31 DIAGNOSIS — R21 Rash and other nonspecific skin eruption: Secondary | ICD-10-CM | POA: Diagnosis not present

## 2019-12-31 MED ORDER — PREDNISONE 10 MG PO TABS: 10.0000 mg | ORAL_TABLET | ORAL | 0 refills | Status: DC

## 2019-12-31 NOTE — Progress Notes (Signed)
New Patient Visit  Subjective  Jennifer Shelton is a 74 y.o. female who presents for the following: Rash (very itchy rash for a little over a month - patient currently using Allegra 180mg  po QD, Singulair 10mg  po QD, Benadryl QD, and Hydroxyzine 25mg  po QD but hasn't noticed an improvement in condition). Patient has experienced a lot of mental and physical stress recently with cardiac stent procedures, imaging studies, and changing of medications. She was prescribed oral Prednisone and also had two IM Kenalog injections but it altered her blood glucose levels.  The following portions of the chart were reviewed this encounter and updated as appropriate:  Tobacco  Allergies  Meds  Problems  Med Hx  Surg Hx  Fam Hx     Review of Systems:  No other skin or systemic complaints except as noted in HPI or Assessment and Plan.  Objective  Well appearing patient in no apparent distress; mood and affect are within normal limits.  A focused examination was performed including the trunk and extremities. Relevant physical exam findings are noted in the Assessment and Plan.  Objective  Trunk, extremities: Urticarial patches and plaques mostly on the trunk.    Assessment & Plan  Rash Trunk, extremities  Probable idiopathic urticaria for at least six weeks - Patient under a lot of mental and physical stress recently related to her health.   Patient started Trazodone July 1st for 2 days but had to stop it due to dizziness.   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 She also has claudication requiring stents to be placed in Aug. 2021 (next week).   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 Currently no treatment for the tumor but plans for further diagnostics in the future.   She was discontinued for Prozac in June and put on another medication which she didn't tolerated. She  then restarted Prozac and then started having hives.   Discussed urticaria with patient and advised her that stress, medications, and dyes used in imaging studies may be the cause of her hives.  Consider Xolair - will send in prescription and see if approved by her insurance.  Increase Allegra 180mg  to 2 tabs po QD and  start Prednisone 10mg  po QOD. #5 0RF.   predniSONE (DELTASONE) 10 MG tablet - Trunk, extremities  Skin / nail biopsy - Trunk, extremities Type of biopsy: punch   Informed consent: discussed and consent obtained   Timeout: patient name, date of birth, surgical site, and procedure verified   Procedure prep:  Patient was prepped and draped in usual sterile fashion (the patient was cleaned and prepped) Prep type:  Isopropyl alcohol Anesthesia: the lesion was anesthetized in a standard fashion   Anesthetic:  1% lidocaine w/ epinephrine 1-100,000 buffered w/ 8.4% NaHCO3 Punch size:  3.5 mm Suture size:  4-0 Suture type: nylon   Hemostasis achieved with: suture, pressure and aluminum chloride   Outcome: patient tolerated procedure well   Post-procedure details: sterile dressing applied and wound care instructions given   Dressing type: bandage, petrolatum and pressure dressing    Specimen 1 - Surgical pathology Differential Diagnosis: D48.5 r/o urticaria vs other  Check Margins: No Urticarial patches and plaques mostly on the trunk.  Return in about 1 week (around 01/07/2020) for suture removal and pathology results.  Luther Redo, CMA, am acting as scribe for Sarina Ser, MD .  Documentation: I have reviewed the above documentation for accuracy and completeness, and I agree with the above.  Sarina Ser, MD

## 2019-12-31 NOTE — Patient Instructions (Signed)

## 2020-01-07 ENCOUNTER — Other Ambulatory Visit: Payer: Self-pay

## 2020-01-07 ENCOUNTER — Ambulatory Visit (INDEPENDENT_AMBULATORY_CARE_PROVIDER_SITE_OTHER): Payer: Medicare Other | Admitting: Dermatology

## 2020-01-07 ENCOUNTER — Encounter: Payer: Self-pay | Admitting: Dermatology

## 2020-01-07 DIAGNOSIS — L509 Urticaria, unspecified: Secondary | ICD-10-CM

## 2020-01-07 MED ORDER — OMALIZUMAB 150 MG ~~LOC~~ SOLR: 300.0000 mg | SUBCUTANEOUS | 3 refills | Status: DC

## 2020-01-07 MED ORDER — PREDNISONE 10 MG PO TABS: 10.0000 mg | ORAL_TABLET | ORAL | 0 refills | Status: DC

## 2020-01-07 NOTE — Progress Notes (Signed)
   Follow-Up Visit   Subjective  Jennifer Shelton is a 74 y.o. female who presents for the following: Urticaria (1 wk, suture removal, trunk. New patches on bilateral arms.).  The following portions of the chart were reviewed this encounter and updated as appropriate: Tobacco  Allergies  Meds  Problems  Med Hx  Surg Hx  Fam Hx     Review of Systems: No other skin or systemic complaints. Except as noted in HPI or assessment and plan.  Objective  Well appearing patient in no apparent distress; mood and affect are within normal limits.  A focused examination was performed including stomach and arms. Relevant physical exam findings are noted in the Assessment and Plan.  Objective  Bilateral Arms: Urticaria plaques on bilateral arms.  Assessment & Plan  Urticaria, biopsy proven - flared and severe Bilateral Arms Pt states that the Prednisone helped to clear up urticaria. Starting another round of Prednisone, 14 pills, 10 mg QD x 7 days, then 10 mg QOD.  Discussed side effects of systemic steroids - short term is water weight gain; increased appetite and moodiness.  Long term side effects more significant.  Do not want to have pt on systemic steroids very long.  Will treat patient until we can see if Xolair approved.  Pt is going out of town and has been flaring recently and needs to be controlled better. Discussed with patient the multiple factors that may cause the urticaria, medications, stress.   Discussed Xolair - will check on approval from insurance.   Follow up in 2-3 weeks.   Ordered Medications: predniSONE (DELTASONE) 10 MG tablet  Return in about 2 weeks (around 01/21/2020) for urticaria follow up.   IHarriett Sine, CMA, am acting as scribe for Sarina Ser, MD.  Documentation: I have reviewed the above documentation for accuracy and completeness, and I agree with the above.  Sarina Ser, MD

## 2020-01-14 ENCOUNTER — Encounter: Payer: Self-pay | Admitting: Dermatology

## 2020-01-22 ENCOUNTER — Ambulatory Visit (INDEPENDENT_AMBULATORY_CARE_PROVIDER_SITE_OTHER): Payer: Medicare Other | Admitting: Dermatology

## 2020-01-22 ENCOUNTER — Other Ambulatory Visit: Payer: Self-pay

## 2020-01-22 ENCOUNTER — Encounter: Payer: Self-pay | Admitting: Dermatology

## 2020-01-22 DIAGNOSIS — L509 Urticaria, unspecified: Secondary | ICD-10-CM | POA: Diagnosis not present

## 2020-01-22 NOTE — Progress Notes (Signed)
   Follow-Up Visit   Subjective  Jennifer Shelton is a 74 y.o. female who presents for the following: Follow-up (Urticaria - much better. She was given a prednisone taper at her last appointment. 01/09/2020 she got a steroid injection while she was in the hospital for stents. Xolair has been sent in and is still in the approval process.).  The following portions of the chart were reviewed this encounter and updated as appropriate:  Tobacco  Allergies  Meds  Problems  Med Hx  Surg Hx  Fam Hx     Review of Systems:  No other skin or systemic complaints except as noted in HPI or Assessment and Plan.  Objective  Well appearing patient in no apparent distress; mood and affect are within normal limits.  A focused examination was performed including hands. Relevant physical exam findings are noted in the Assessment and Plan.  Objective  Right dorsum hand: Urticarial plaque of right dorsum hand.   Assessment & Plan  Urticaria -severe and persistent Right dorsum hand and all over skin Better controlled today on systemic steroids. Continue prednisone until finished.  Will continue to wait on Xolair approval.  Dr. Sabra Heck recommended evaluation with an allergist and we agree and recommend that she schedule appointment.  Discussed patch testing but not likely helpful at this time.  Return in about 2 weeks (around 02/05/2020) for urticaria - will postpone if Xolair has not been approved.Doylene Bode, CMA, am acting as scribe for Sarina Ser, MD .  Documentation: I have reviewed the above documentation for accuracy and completeness, and I agree with the above.  Sarina Ser, MD

## 2020-01-26 ENCOUNTER — Other Ambulatory Visit: Payer: Self-pay | Admitting: Dermatology

## 2020-01-26 DIAGNOSIS — L509 Urticaria, unspecified: Secondary | ICD-10-CM

## 2020-02-05 ENCOUNTER — Telehealth: Payer: Self-pay

## 2020-02-05 ENCOUNTER — Ambulatory Visit: Payer: Medicare Other | Admitting: Dermatology

## 2020-02-05 NOTE — Telephone Encounter (Signed)
Left message on patient's voicemail to return my call. Patient's Xolair still not approved and her medication is not here yet. We need to reschedule today's appointment to a later date.

## 2020-02-11 ENCOUNTER — Telehealth: Payer: Self-pay

## 2020-02-11 NOTE — Telephone Encounter (Signed)
Does this pt have an appt with Allergist? Do we know what is holding up approval for Xolair? Do we have any samples to start Xolair? I would recommend pt call insurance to complain about failure to approve Xolair. I would prefer to wait on above, but can send in:  Prednisone 20 mg 1 po qd x 3 days then 1 po qod x 4 doses Disp #7  no rf.  If not already scheduled soon, make appt for pt by 2 weeks with me.

## 2020-02-11 NOTE — Telephone Encounter (Signed)
Patient called regarding Xolair RX. This is still processing with her insurance. She is asking can you RF another round of Prednisone due to patient being miserable and finding no relief?

## 2020-02-12 MED ORDER — PREDNISONE 20 MG PO TABS: 20.0000 mg | ORAL_TABLET | ORAL | 0 refills | Status: DC

## 2020-02-12 NOTE — Telephone Encounter (Signed)
Patients PCP is Dr. Emily Filbert who recommended she still with only a dermatologist at this time so she is unsure on about scheduling with a allergist. Prednisone sent in with directions to [facility]. Patient has been scheduled for a two week follow up.

## 2020-02-24 ENCOUNTER — Other Ambulatory Visit: Payer: Self-pay

## 2020-02-24 MED ORDER — OMALIZUMAB 150 MG ~~LOC~~ SOLR: 300.0000 mg | SUBCUTANEOUS | 3 refills | Status: DC

## 2020-02-24 NOTE — Progress Notes (Signed)
Per Iantha Fallen, script needs to be filled with [facility].

## 2020-02-25 ENCOUNTER — Other Ambulatory Visit: Payer: Self-pay

## 2020-02-25 MED ORDER — OMALIZUMAB 150 MG ~~LOC~~ SOLR: 300.0000 mg | SUBCUTANEOUS | 3 refills | Status: DC

## 2020-02-25 NOTE — Progress Notes (Signed)
Dosage correction

## 2020-02-26 ENCOUNTER — Ambulatory Visit (INDEPENDENT_AMBULATORY_CARE_PROVIDER_SITE_OTHER): Payer: Medicare Other | Admitting: Dermatology

## 2020-02-26 ENCOUNTER — Other Ambulatory Visit: Payer: Self-pay

## 2020-02-26 DIAGNOSIS — L501 Idiopathic urticaria: Secondary | ICD-10-CM

## 2020-02-26 DIAGNOSIS — L509 Urticaria, unspecified: Secondary | ICD-10-CM | POA: Diagnosis not present

## 2020-02-26 NOTE — Progress Notes (Signed)
   Follow-Up Visit   Subjective  Jennifer Shelton is a 74 y.o. female who presents for the following: Urticaria (patient currently using Benadryl QHS, Hydroxyzine TID, Allegra 180mg  2 po QD, and Singulair. Patient finished the Prednisone taper on 02/24/20, and states that hives are well controlled as long as she uses Prednisone. Xolair has been approved, but has not yet been shipped to our office).  The following portions of the chart were reviewed this encounter and updated as appropriate:  Tobacco  Allergies  Meds  Problems  Med Hx  Surg Hx  Fam Hx     Review of Systems:  No other skin or systemic complaints except as noted in HPI or Assessment and Plan.  Objective  Well appearing patient in no apparent distress; mood and affect are within normal limits.  A focused examination was performed including the arms and chest . Relevant physical exam findings are noted in the Assessment and Plan.  Objective  Trunk, extremities: Purpura, otherwise clear.   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   Continue Allegra 180mg  2 po QAM, Singulair po QD, Hydroxyzine (try to decrease due to drowsiness) BID, and Benadryl QHS.   Discussed with patient that Prednisone is not a long term use medication and we will only prescribe it as needed. She is doing well at this time so Prednisone is not needed.   Xolair injections as soon as shipped.  Pt to call Pharmacy today.   Return for follow up - We will contact patient to schedule an appointment once Xolair has arrived.  Luther Redo, CMA, am acting as scribe for Sarina Ser, MD .  Documentation: I have reviewed the above documentation for accuracy and completeness, and I agree with the above.  Sarina Ser, MD

## 2020-02-27 ENCOUNTER — Encounter: Payer: Self-pay | Admitting: Dermatology

## 2020-03-03 ENCOUNTER — Telehealth: Payer: Self-pay

## 2020-03-03 NOTE — Telephone Encounter (Signed)
Tried calling patient today to get on Dr. Alveria Apley schedule. We have received her Xolair injections.

## 2020-03-04 ENCOUNTER — Ambulatory Visit (INDEPENDENT_AMBULATORY_CARE_PROVIDER_SITE_OTHER): Payer: Medicare Other | Admitting: Dermatology

## 2020-03-04 ENCOUNTER — Other Ambulatory Visit: Payer: Self-pay

## 2020-03-04 DIAGNOSIS — L509 Urticaria, unspecified: Secondary | ICD-10-CM

## 2020-03-04 MED ORDER — OMALIZUMAB 150 MG/ML ~~LOC~~ SOSY
150.0000 mg | PREFILLED_SYRINGE | Freq: Once | SUBCUTANEOUS | Status: AC
  Administered 2020-03-04: 150 mg via SUBCUTANEOUS

## 2020-03-04 NOTE — Progress Notes (Signed)
   Follow-Up Visit   Subjective  Jennifer Shelton is a 74 y.o. female who presents for the following: Urticaria severe with pruritis (trunk, extremities, Continue Allegra 180mg  2 po QAM, Singulair po QD, Hydroxyzine qd/BID, and Benadryl QHS. Pt started oral prednisone 20mg  qod on 02/29/20 ).  The following portions of the chart were reviewed this encounter and updated as appropriate:  Tobacco  Allergies  Meds  Problems  Med Hx  Surg Hx  Fam Hx     Review of Systems:  No other skin or systemic complaints except as noted in HPI or Assessment and Plan.  Objective  Well appearing patient in no apparent distress; mood and affect are within normal limits.  A focused examination was performed including face, arms. Relevant physical exam findings are noted in the Assessment and Plan.  Objective  trunk, extremities: Arms with purpura otherwise clear   Assessment & Plan  Urticaria trunk, extremities Urticaria - Severe Chronic Idiopathic Urticaria with frequent and recent flare and pruritus With Pruritis with recent flare  Cont Allegra 180mg  2 po QAM  Cont Singulair po QD  Cont Hydroxyzine (try to decrease due to drowsiness) BID Cont Benadryl QHS Cont Prednisone 20mg  1 po qod as prescribed by Dr. Sabra Heck to finish 10 day course.   Start Xolair 150mg /ml sub injections today. Xolair 150mg /ml injected into R and L upper arm Lot [id number] exp 11/22, Lot [id number] 03/2021 BP before Xolair injection 3:40 124/64 BP right after Xolair injection 4:05 156/71 BP 15 minutes after Xolair injection 4:20 122/70 BP 30 minutes after Xolair injection 4:35 125/69  omalizumab (XOLAIR) prefilled syringe 150 mg - trunk, extremities  omalizumab Arvid Right) prefilled syringe 150 mg - trunk, extremities  Other Related Medications predniSONE (DELTASONE) 10 MG tablet  Return in about 4 weeks (around 04/01/2020) for Xolair/Urticaria.   I, Jennifer Shelton, RMA, am acting as scribe for Sarina Ser, MD  .  Documentation: I have reviewed the above documentation for accuracy and completeness, and I agree with the above.  Sarina Ser, MD

## 2020-03-09 ENCOUNTER — Encounter: Payer: Self-pay | Admitting: Dermatology

## 2020-04-06 ENCOUNTER — Ambulatory Visit: Payer: Medicare Other | Admitting: Dermatology

## 2020-04-07 ENCOUNTER — Telehealth: Payer: Self-pay

## 2020-04-07 NOTE — Telephone Encounter (Signed)
Patient called and cancelled follow up appointments with Dr. Nehemiah Massed for [place]. She states other physicians have figured out her Plavix is what caused patient's hives.   She is no longer doing Xolair.

## 2020-07-09 ENCOUNTER — Other Ambulatory Visit: Payer: Self-pay

## 2020-07-09 ENCOUNTER — Emergency Department: Payer: Medicare Other

## 2020-07-09 ENCOUNTER — Inpatient Hospital Stay
Admission: EM | Admit: 2020-07-09 | Discharge: 2020-07-15 | DRG: 056 | Disposition: A | Discharge status: Home / Self Care | Payer: Medicare Other | Attending: Student | Admitting: Student

## 2020-07-09 ENCOUNTER — Observation Stay: Payer: Medicare Other

## 2020-07-09 DIAGNOSIS — U071 COVID-19: Secondary | ICD-10-CM | POA: Diagnosis present

## 2020-07-09 DIAGNOSIS — Z7952 Long term (current) use of systemic steroids: Secondary | ICD-10-CM

## 2020-07-09 DIAGNOSIS — M25551 Pain in right hip: Secondary | ICD-10-CM | POA: Diagnosis present

## 2020-07-09 DIAGNOSIS — I10 Essential (primary) hypertension: Secondary | ICD-10-CM | POA: Diagnosis present

## 2020-07-09 DIAGNOSIS — H02401 Unspecified ptosis of right eyelid: Secondary | ICD-10-CM | POA: Diagnosis present

## 2020-07-09 DIAGNOSIS — Z888 Allergy status to other drugs, medicaments and biological substances status: Secondary | ICD-10-CM

## 2020-07-09 DIAGNOSIS — I6521 Occlusion and stenosis of right carotid artery: Secondary | ICD-10-CM | POA: Diagnosis present

## 2020-07-09 DIAGNOSIS — G7001 Myasthenia gravis with (acute) exacerbation: Secondary | ICD-10-CM | POA: Diagnosis not present

## 2020-07-09 DIAGNOSIS — Z87891 Personal history of nicotine dependence: Secondary | ICD-10-CM

## 2020-07-09 DIAGNOSIS — M797 Fibromyalgia: Secondary | ICD-10-CM | POA: Diagnosis present

## 2020-07-09 DIAGNOSIS — K219 Gastro-esophageal reflux disease without esophagitis: Secondary | ICD-10-CM | POA: Diagnosis present

## 2020-07-09 DIAGNOSIS — E119 Type 2 diabetes mellitus without complications: Secondary | ICD-10-CM

## 2020-07-09 DIAGNOSIS — M1990 Unspecified osteoarthritis, unspecified site: Secondary | ICD-10-CM | POA: Diagnosis present

## 2020-07-09 DIAGNOSIS — G8929 Other chronic pain: Secondary | ICD-10-CM | POA: Diagnosis present

## 2020-07-09 DIAGNOSIS — Z7982 Long term (current) use of aspirin: Secondary | ICD-10-CM

## 2020-07-09 DIAGNOSIS — E785 Hyperlipidemia, unspecified: Secondary | ICD-10-CM | POA: Diagnosis present

## 2020-07-09 DIAGNOSIS — Z79899 Other long term (current) drug therapy: Secondary | ICD-10-CM

## 2020-07-09 DIAGNOSIS — R2689 Other abnormalities of gait and mobility: Secondary | ICD-10-CM | POA: Diagnosis present

## 2020-07-09 DIAGNOSIS — G7000 Myasthenia gravis without (acute) exacerbation: Secondary | ICD-10-CM | POA: Diagnosis present

## 2020-07-09 DIAGNOSIS — R2990 Unspecified symptoms and signs involving the nervous system: Secondary | ICD-10-CM

## 2020-07-09 DIAGNOSIS — E1140 Type 2 diabetes mellitus with diabetic neuropathy, unspecified: Secondary | ICD-10-CM

## 2020-07-09 DIAGNOSIS — E1142 Type 2 diabetes mellitus with diabetic polyneuropathy: Secondary | ICD-10-CM | POA: Diagnosis present

## 2020-07-09 DIAGNOSIS — F32A Depression, unspecified: Secondary | ICD-10-CM | POA: Diagnosis present

## 2020-07-09 DIAGNOSIS — K589 Irritable bowel syndrome without diarrhea: Secondary | ICD-10-CM | POA: Diagnosis present

## 2020-07-09 DIAGNOSIS — E1165 Type 2 diabetes mellitus with hyperglycemia: Secondary | ICD-10-CM | POA: Diagnosis present

## 2020-07-09 DIAGNOSIS — M545 Low back pain, unspecified: Secondary | ICD-10-CM | POA: Diagnosis present

## 2020-07-09 DIAGNOSIS — Z7984 Long term (current) use of oral hypoglycemic drugs: Secondary | ICD-10-CM

## 2020-07-09 DIAGNOSIS — Z794 Long term (current) use of insulin: Secondary | ICD-10-CM

## 2020-07-09 DIAGNOSIS — Z8719 Personal history of other diseases of the digestive system: Secondary | ICD-10-CM

## 2020-07-09 LAB — COMPREHENSIVE METABOLIC PANEL
ALT: 28 U/L (ref 0–44)
AST: 27 U/L (ref 15–41)
Albumin: 4.4 g/dL (ref 3.5–5.0)
Alkaline Phosphatase: 64 U/L (ref 38–126)
Anion gap: 10 (ref 5–15)
BUN: 18 mg/dL (ref 8–23)
CO2: 25 mmol/L (ref 22–32)
Calcium: 9.2 mg/dL (ref 8.9–10.3)
Chloride: 102 mmol/L (ref 98–111)
Creatinine, Ser: 0.74 mg/dL (ref 0.44–1.00)
GFR, Estimated: >60 mL/min (ref >60)
Glucose, Bld: 138 mg/dL — ABNORMAL HIGH (ref 70–99)
Potassium: 4 mmol/L (ref 3.5–5.1)
Sodium: 137 mmol/L (ref 135–145)
Total Bilirubin: 1.1 mg/dL (ref 0.3–1.2)
Total Protein: 7.6 g/dL (ref 6.5–8.1)

## 2020-07-09 LAB — DIFFERENTIAL
Abs Immature Granulocytes: 0.03 10*3/uL (ref 0.00–0.07)
Basophils Absolute: 0 10*3/uL (ref 0.0–0.1)
Basophils Relative: 0 %
Eosinophils Absolute: 0 10*3/uL (ref 0.0–0.5)
Eosinophils Relative: 0 %
Immature Granulocytes: 0 %
Lymphocytes Relative: 33 %
Lymphs Abs: 2.5 10*3/uL (ref 0.7–4.0)
Monocytes Absolute: 0.6 10*3/uL (ref 0.1–1.0)
Monocytes Relative: 8 %
Neutro Abs: 4.4 10*3/uL (ref 1.7–7.7)
Neutrophils Relative %: 59 %

## 2020-07-09 LAB — PROTIME-INR
INR: 1 (ref 0.8–1.2)
Prothrombin Time: 12.5 seconds (ref 11.4–15.2)

## 2020-07-09 LAB — CBC
HCT: 43.8 % (ref 36.0–46.0)
Hemoglobin: 14.3 g/dL (ref 12.0–15.0)
MCH: 26.8 pg (ref 26.0–34.0)
MCHC: 32.6 g/dL (ref 30.0–36.0)
MCV: 82.2 fL (ref 80.0–100.0)
Platelets: 185 10*3/uL (ref 150–400)
RBC: 5.33 MIL/uL — ABNORMAL HIGH (ref 3.87–5.11)
RDW: 13.6 % (ref 11.5–15.5)
WBC: 7.5 10*3/uL (ref 4.0–10.5)
nRBC: 0 % (ref 0.0–0.2)

## 2020-07-09 LAB — APTT: aPTT: 33 seconds (ref 24–36)

## 2020-07-09 LAB — GLUCOSE, CAPILLARY: Glucose-Capillary: 166 mg/dL — ABNORMAL HIGH (ref 70–99)

## 2020-07-09 IMAGING — CT CT HEAD W/O CM
3 series · 15 of 47 positions shown, 18 images · non-contrast
Comparison: CT angiogram head/neck [DATE]. Brain MRI
[DATE].

CLINICAL DATA: Neuro deficit, acute, stroke suspected. Additional
history provided: Stumbling, gait difficulty for 3 weeks, blurred
vision this morning.

EXAM:
CT HEAD WITHOUT CONTRAST
TECHNIQUE: Contiguous axial images were obtained from the base of the skull
through the vertex without intravenous contrast.

[Series 3: head wo · axial · 0.40mm/px · z∈[-142,-17]mm · 9 of 31 slices shown, 12 images]
[im 3/31  brain]
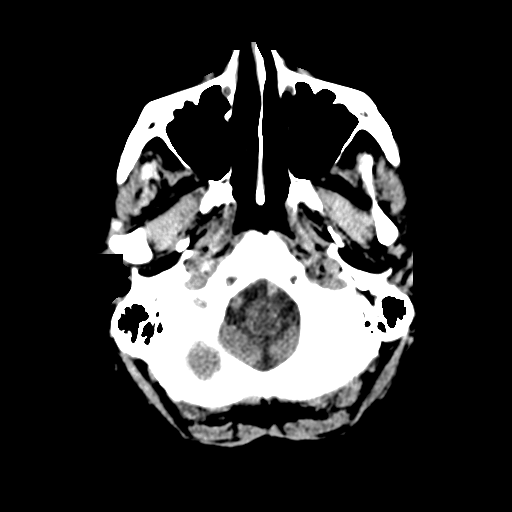
[im 3/31  bone]
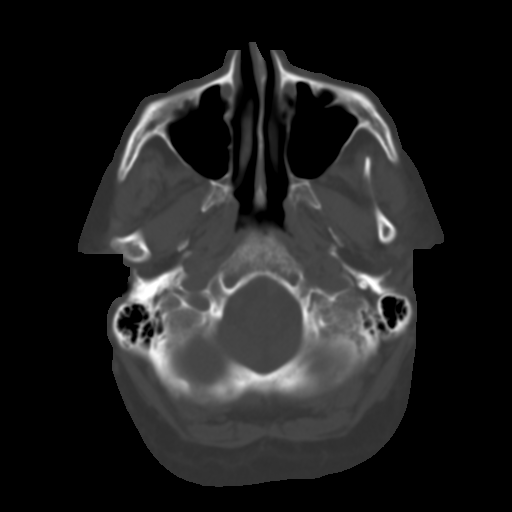
[im 6/31  brain]
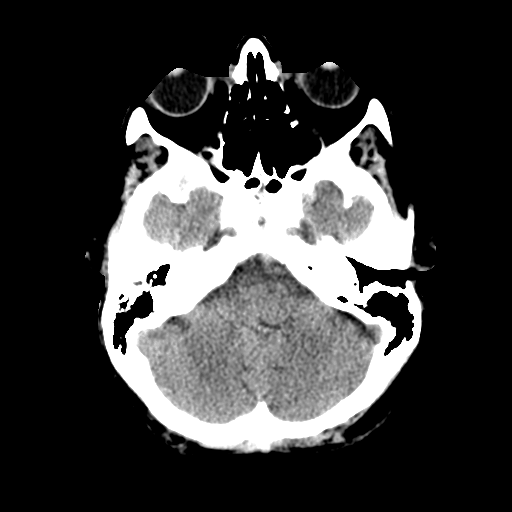
[im 9/31  brain]
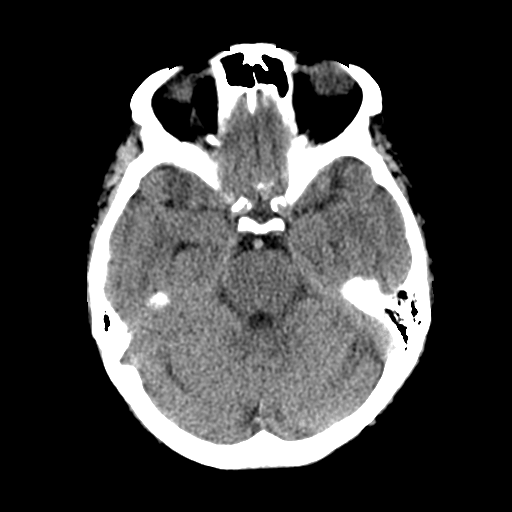
[im 12/31  brain]
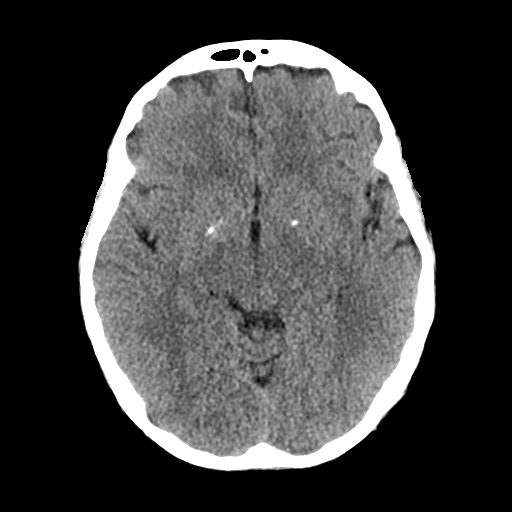
[im 16/31  brain]
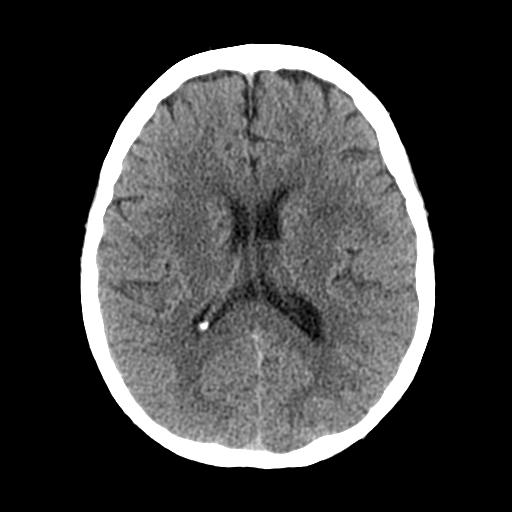
[im 16/31  bone]
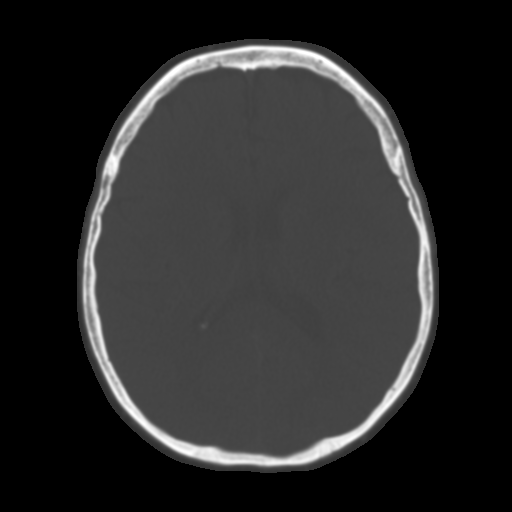
[im 19/31  brain]
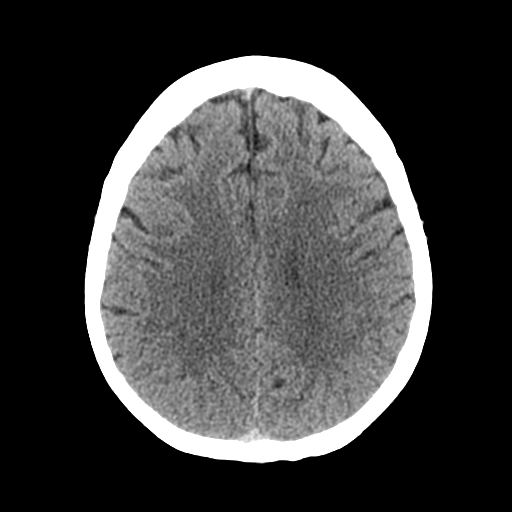
[im 22/31  brain]
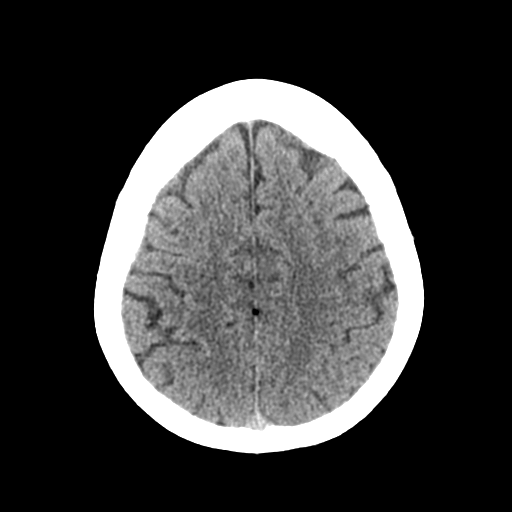
[im 25/31  brain]
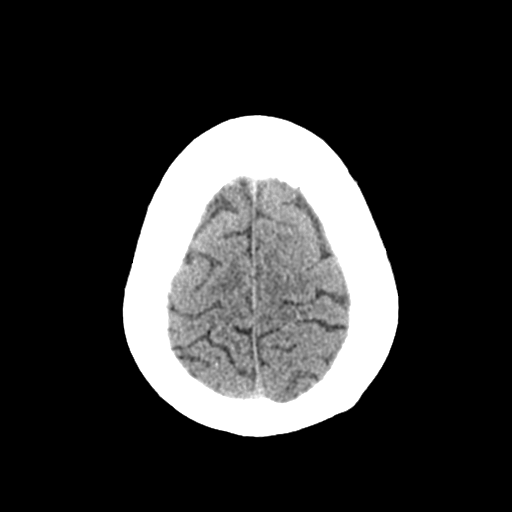
[im 28/31  brain]
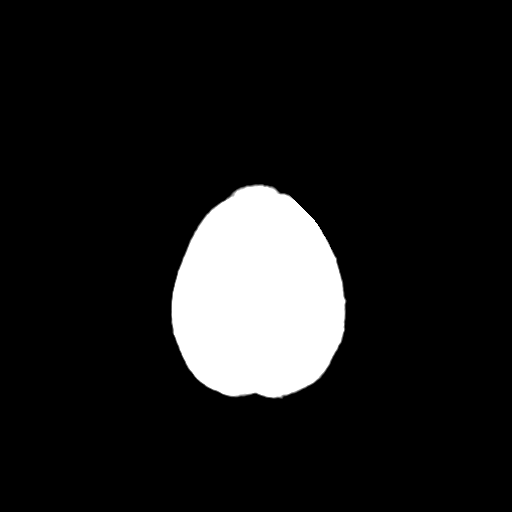
[im 28/31  bone]
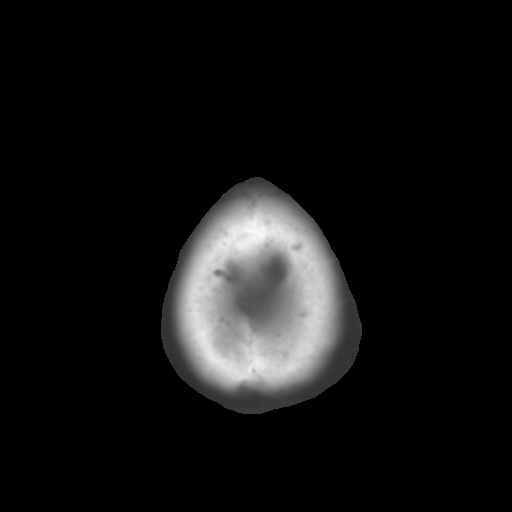

[Series 4: coronal soft tissue · coronal · 0.33mm/px · 3 of 68 slices shown]
[im 23/68  brain]
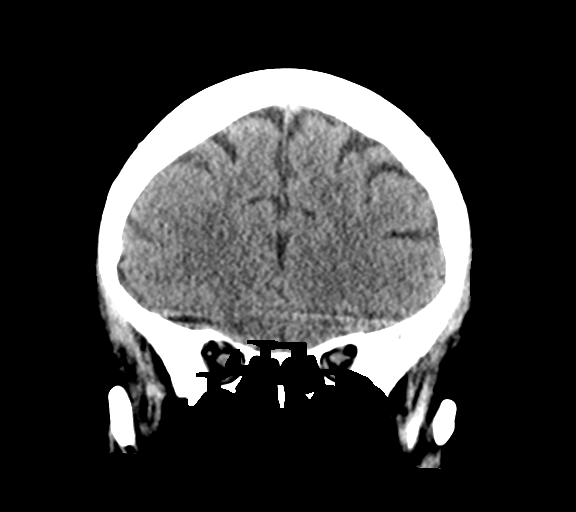
[im 30/68  brain]
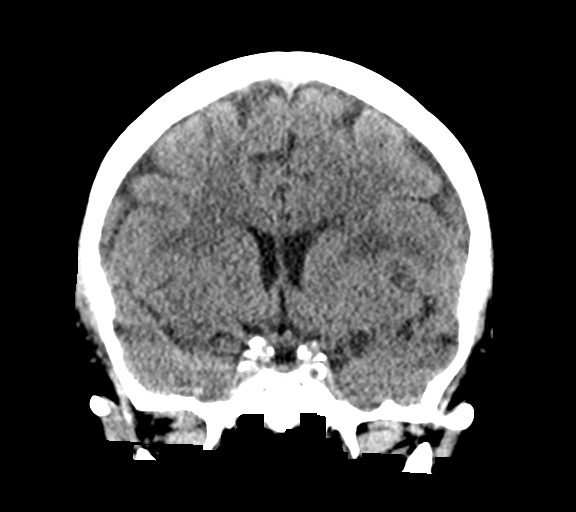
[im 38/68  brain]
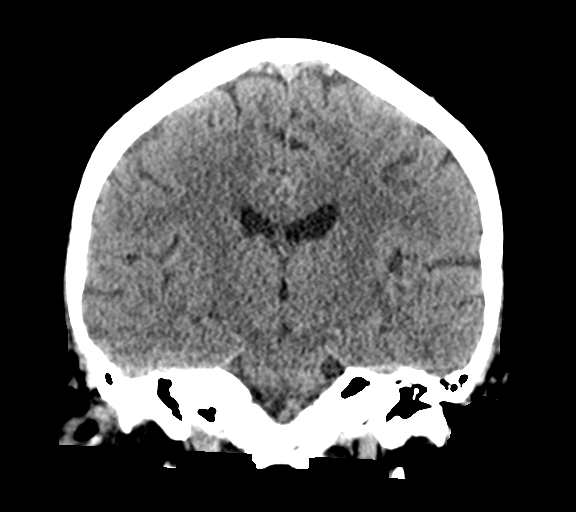

[Series 5: sagittal soft tissue · sagittal · 0.33mm/px · 3 of 55 slices shown]
[im 19/55  brain]
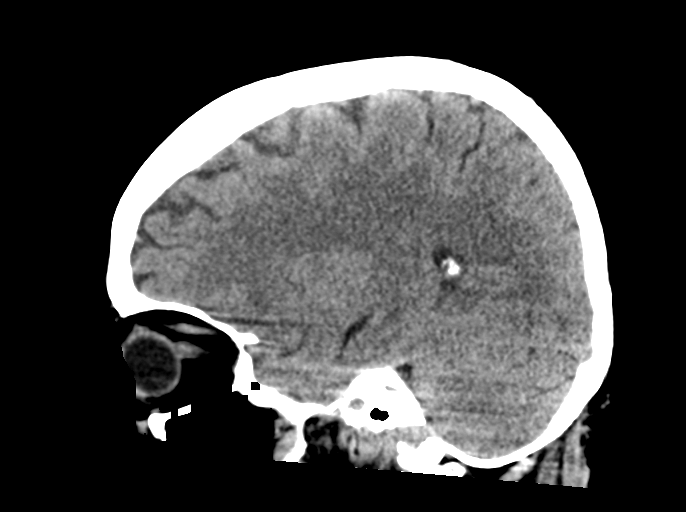
[im 28/55  brain]
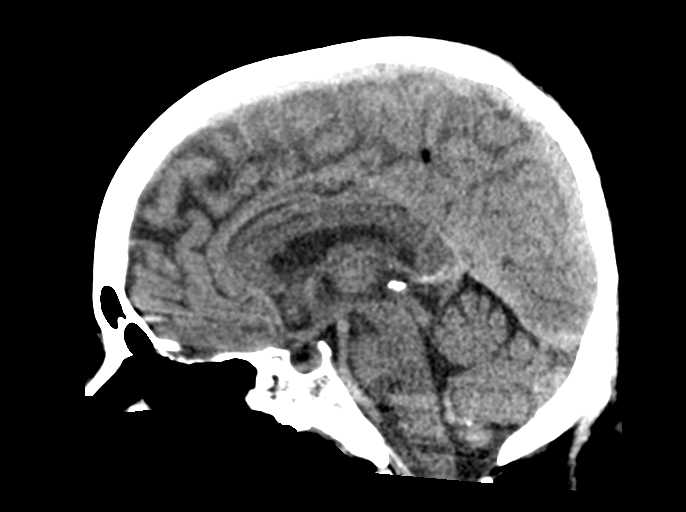
[im 37/55  brain]
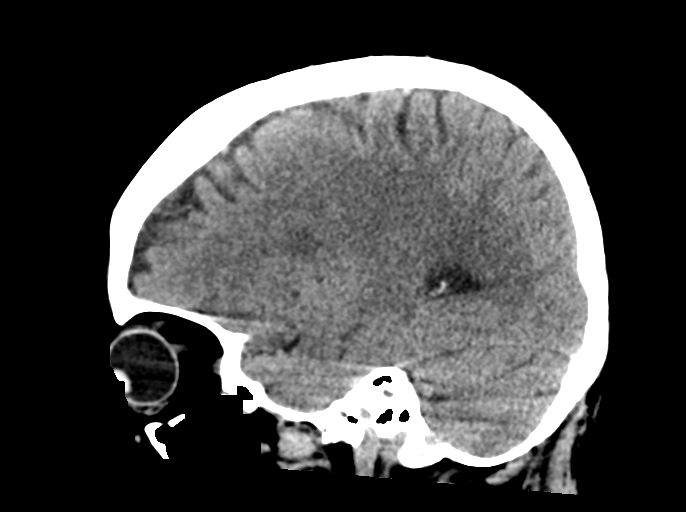

[15 of 47 positions shown; findings below may reference images not displayed]

FINDINGS: Brain:

Cerebral volume is normal for age.

Mild patchy and ill-defined hypoattenuation within the cerebral
white matter is nonspecific, but compatible with chronic small
vessel ischemic disease.

Redemonstrated bilateral basal ganglia calcifications.

There is no acute intracranial hemorrhage.

No demarcated cortical infarct.

No extra-axial fluid collection.

No evidence of intracranial mass.

No midline shift.

Vascular: No hyperdense vessel.  Atherosclerotic calcifications.

Skull: Normal. Negative for fracture or focal suspicious osseous
lesion.

Sinuses/Orbits: Visualized orbits show no acute finding. No
significant paranasal sinus disease at the imaged levels.
IMPRESSION: No evidence of acute intracranial abnormality.

Stable mild cerebral white matter chronic small vessel ischemic
disease.

## 2020-07-09 IMAGING — MR MR HEAD W/O CM
12 series · 44 of 48 positions shown · non-contrast
Comparison: Head CT [DATE]. CT angiogram head/neck [DATE].
Brain MRI [DATE].

CLINICAL DATA: Diplopia.

EXAM:
MRI HEAD WITHOUT CONTRAST
TECHNIQUE: Multiplanar, multiecho pulse sequences of the brain and surrounding
structures were obtained without intravenous contrast.

[Series 5: ax dwi_tracew · axial · 3.0mm · 0.60mm/px · z∈[-91,+64]mm · 4 of 48 slices shown]
[im 1/48]
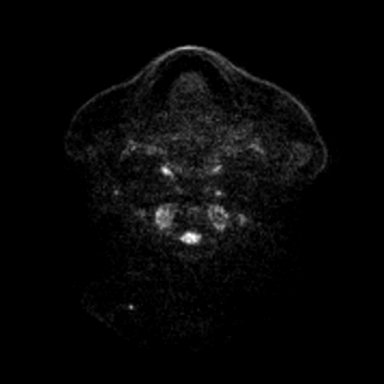
[im 16/48]
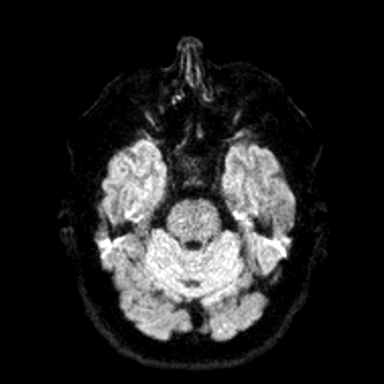
[im 32/48]
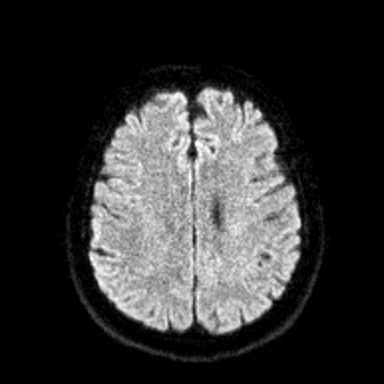
[im 48/48]
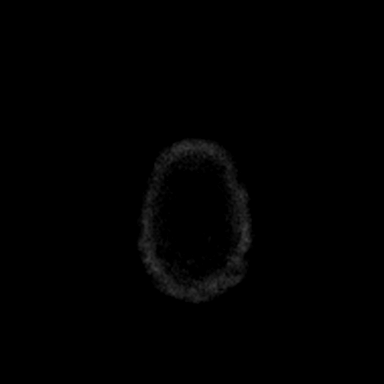

[Series 6: ax dwi_adc · axial · 3.0mm · 0.60mm/px · z∈[-91,+64]mm · 3 of 48 slices shown]
[im 1/48]
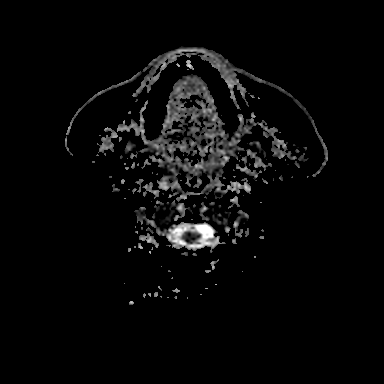
[im 24/48]
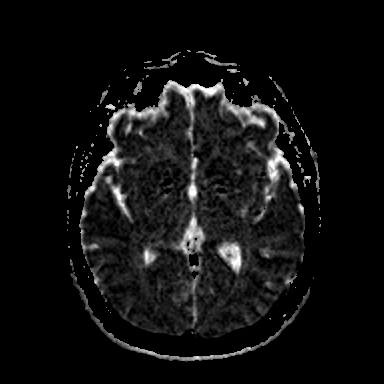
[im 48/48]
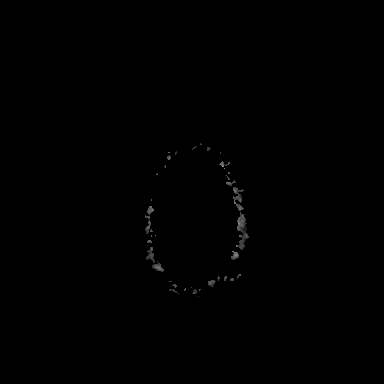

[Series 7: cor dwi_tracew · coronal · 5.0mm · 0.60mm/px · 3 of 40 slices shown]
[im 1/40]
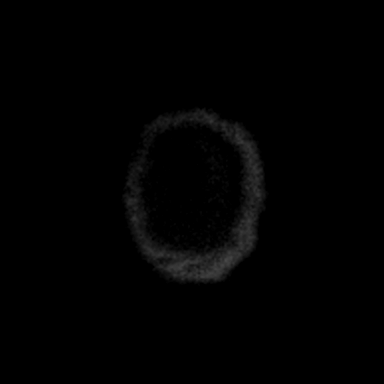
[im 20/40]
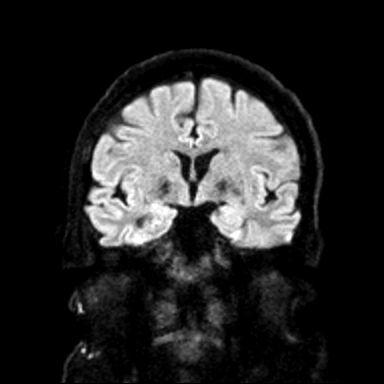
[im 40/40]
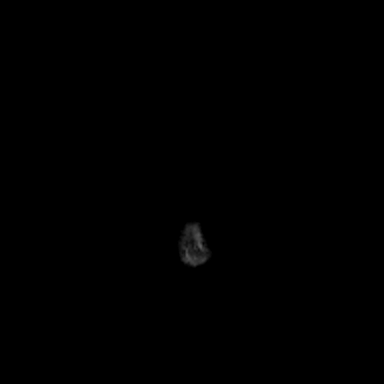

[Series 8: cor dwi_adc · coronal · 5.0mm · 0.60mm/px · 3 of 40 slices shown]
[im 1/40]
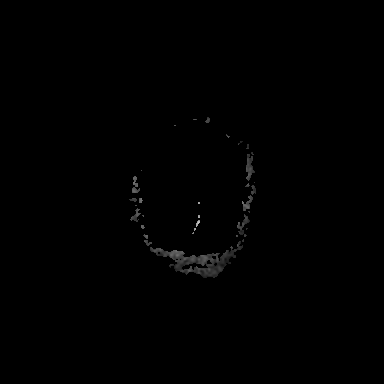
[im 20/40]
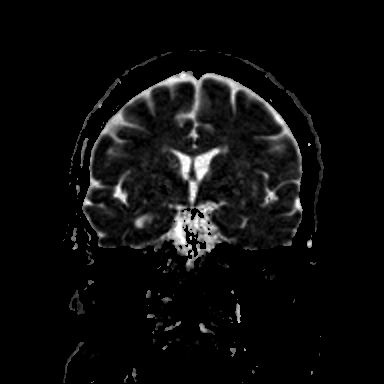
[im 40/40]
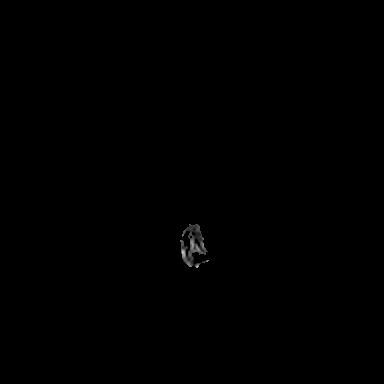

[Series 9: T1 · sagittal · 5.0mm · 0.62mm/px · 2 of 23 slices shown (1 of 2)]
[im 1/23]
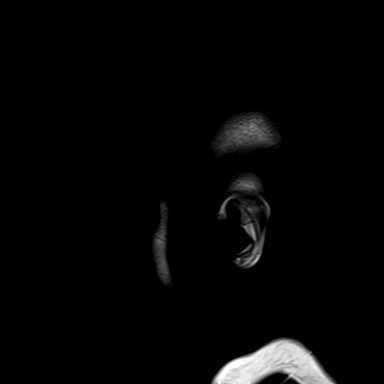
[im 23/23]
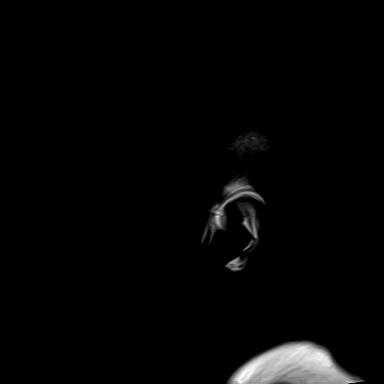

[Series 10: T2 · axial · 5.0mm · 0.53mm/px · z∈[-86,+57]mm · 2 of 25 slices shown (1 of 2)]
[im 1/25]
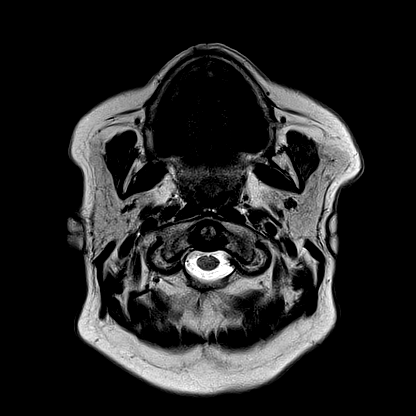
[im 25/25]
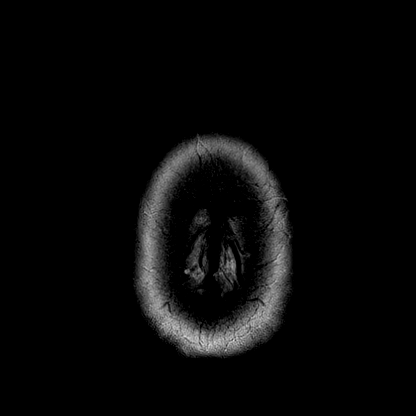

[Series 11: mag_images · axial · 3.0mm · 0.90mm/px · z∈[-102,+74]mm · 4 of 60 slices shown]
[im 1/60]
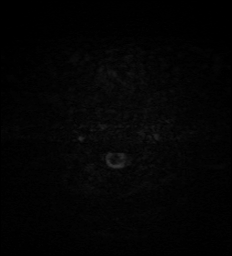
[im 20/60]
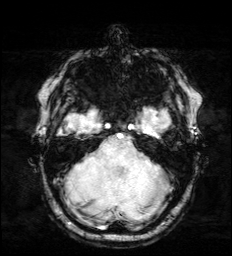
[im 40/60]
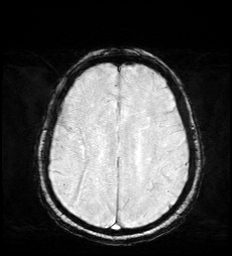
[im 60/60]
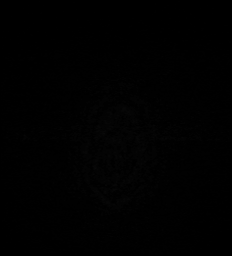

[Series 12: pha_images · axial · 3.0mm · 0.90mm/px · z∈[-99,+71]mm · 4 of 58 slices shown]
[im 1/58]
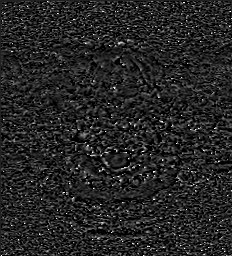
[im 20/58]
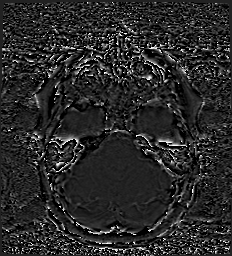
[im 39/58]
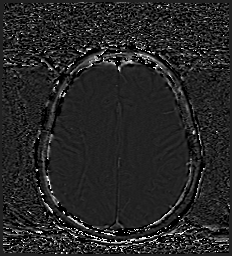
[im 58/58]
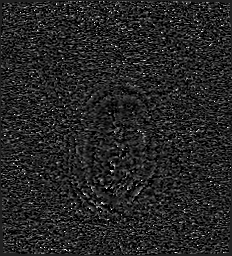

[Series 13: swi_images · axial · 3.0mm · 0.90mm/px · z∈[-102,+74]mm · 4 of 60 slices shown]
[im 1/60]
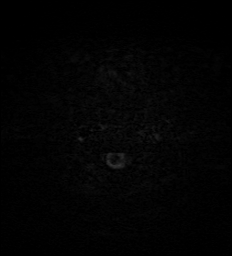
[im 20/60]
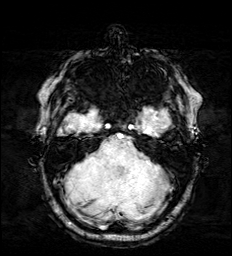
[im 40/60]
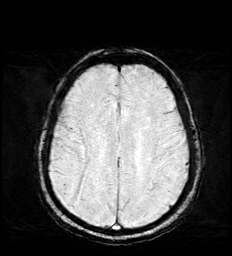
[im 60/60]
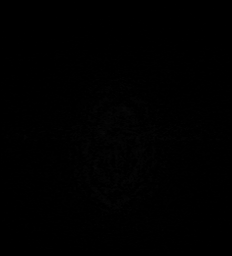

[Series 15: FLAIR · axial · 3.0mm · 0.53mm/px · z∈[-95,+66]mm · 4 of 55 slices shown]
[im 1/55]
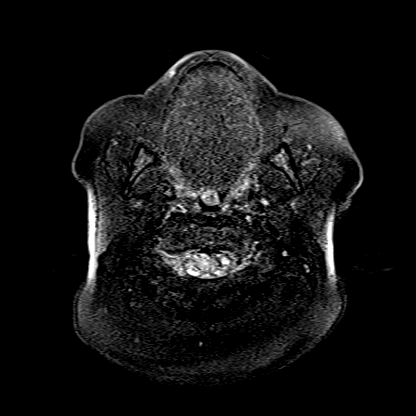
[im 19/55]
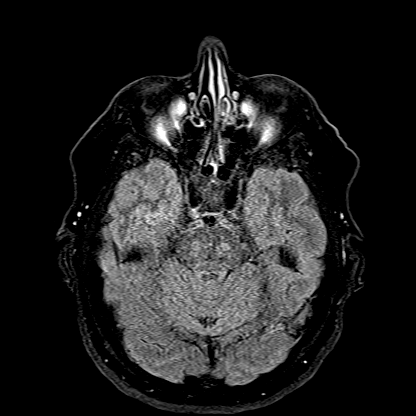
[im 37/55]
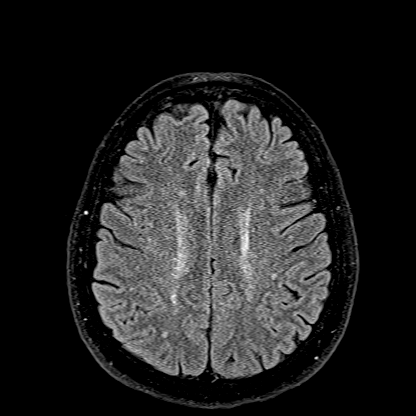
[im 55/55]
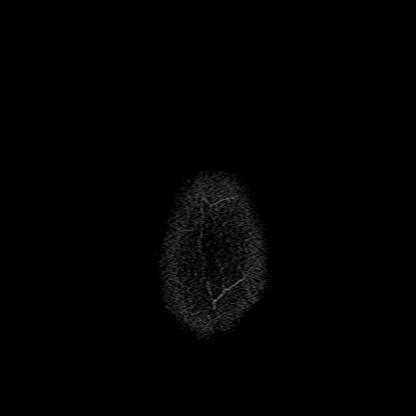

[Series 16: T1 · axial · 1.0mm · 0.98mm/px · z∈[-100,+74]mm · 9 of 176 slices shown (2 of 2)]
[im 1/176]
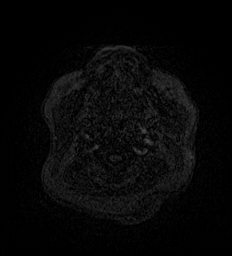
[im 15/176]
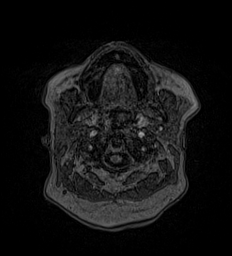
[im 30/176]
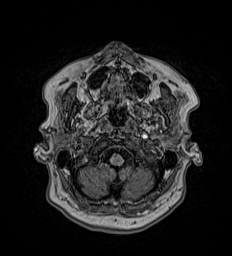
[im 59/176]
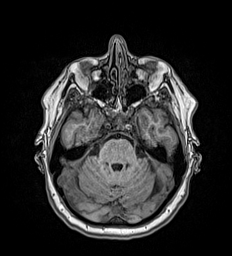
[im 73/176]
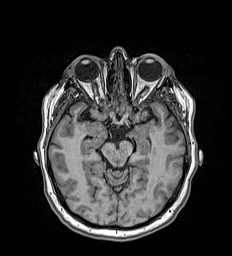
[im 103/176]
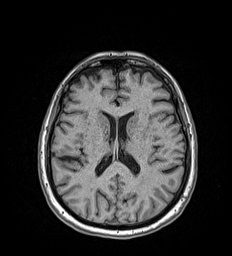
[im 117/176]
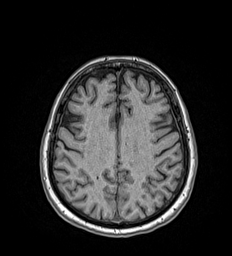
[im 146/176]
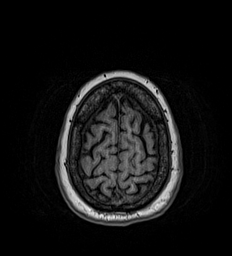
[im 176/176]
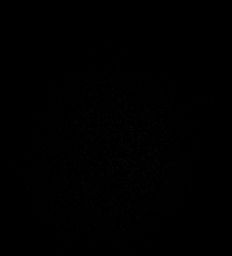

[Series 17: T2 · coronal · 5.0mm · 0.57mm/px · 2 of 29 slices shown (2 of 2)]
[im 1/29]
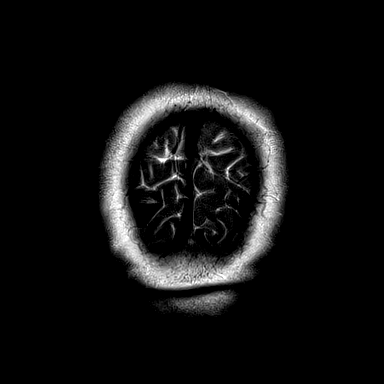
[im 29/29]
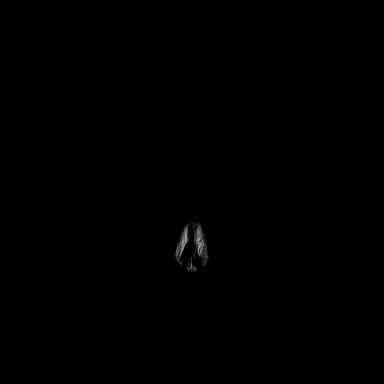

[44 of 48 positions shown; findings below may reference images not displayed]

FINDINGS: Brain:

Cerebral volume is normal for age.

Moderate multifocal T2/FLAIR hyperintensity within the cerebral
white matter and pons is nonspecific, but compatible with chronic
small vessel ischemic disease.

There are a few scattered nonspecific chronic microhemorrhages
within the right frontal lobe which are new as compared to the prior
brain MRI of [DATE].

There is no acute infarct.

No evidence of intracranial mass.

No extra-axial fluid collection.

No midline shift.

Vascular: Expected proximal arterial flow voids.

Skull and upper cervical spine: No focal marrow lesion.

Sinuses/Orbits: Visualized orbits show no acute finding. Trace
bilateral ethmoid sinus and left maxillary sinus mucosal thickening.
IMPRESSION: No evidence of acute intracranial abnormality.

Moderate chronic small vessel ischemic disease within the cerebral
white matter and pons, stable from the brain MRI of [DATE].

There are a few scattered chronic microhemorrhages within the right
frontal lobe, which are new from the prior brain MRI.

## 2020-07-09 IMAGING — MR MR HEAD W/ CM
7 series · 45 of 48 positions shown · IV contrast (gadavist)
Comparison: Noncontrast study earlier same day.

CLINICAL DATA: Diplopia.

EXAM:
MRI HEAD WITH CONTRAST
TECHNIQUE: Multiplanar, multiecho pulse sequences of the brain and surrounding
structures were obtained with intravenous contrast.
CONTRAST:  8mL GADAVIST GADOBUTROL 1 MMOL/ML IV SOLN

[Series 5: ax dwi_tracew · axial · 3.0mm · 0.60mm/px · z∈[-105,+50]mm · 5 of 48 slices shown]
[im 1/48]
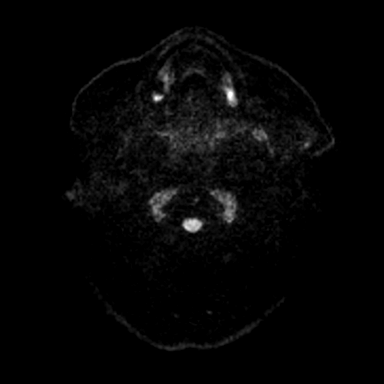
[im 12/48]
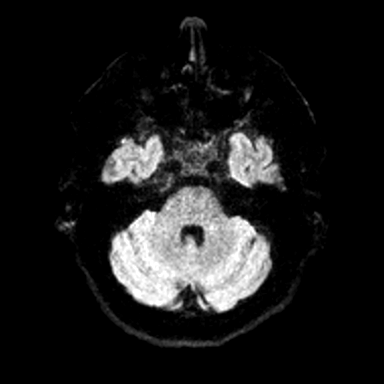
[im 24/48]
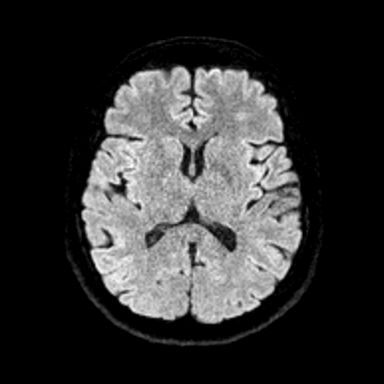
[im 36/48]
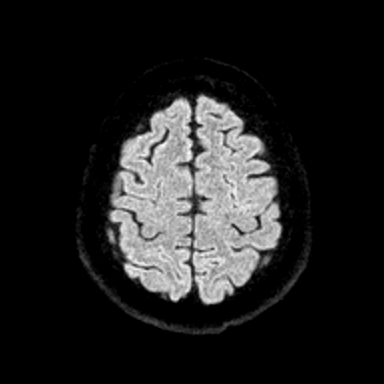
[im 48/48]
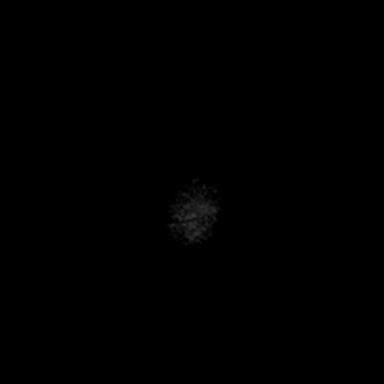

[Series 6: ax dwi_adc · axial · 3.0mm · 0.60mm/px · z∈[-105,+50]mm · 6 of 48 slices shown]
[im 1/48]
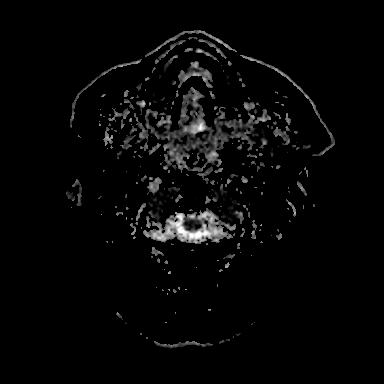
[im 10/48]
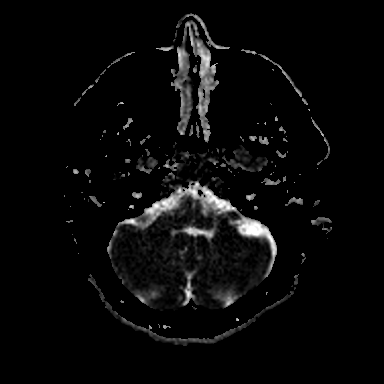
[im 19/48]
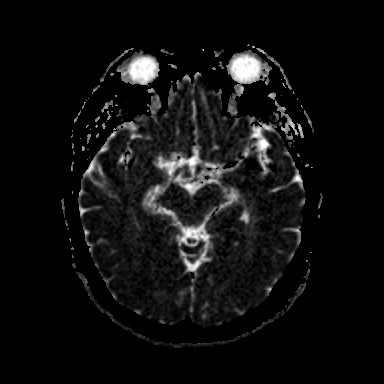
[im 29/48]
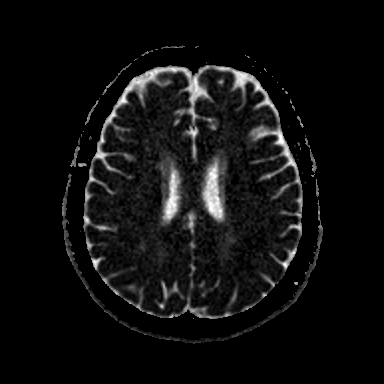
[im 38/48]
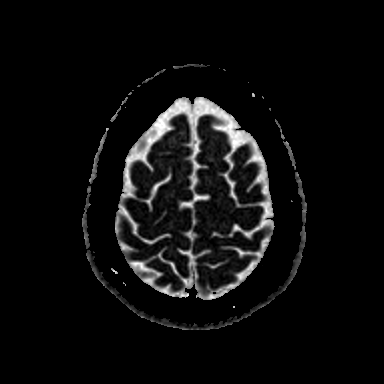
[im 48/48]
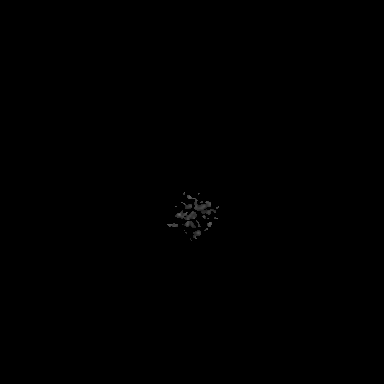

[Series 7: cor dwi_tracew · coronal · 5.0mm · 0.60mm/px · 5 of 40 slices shown]
[im 1/40]
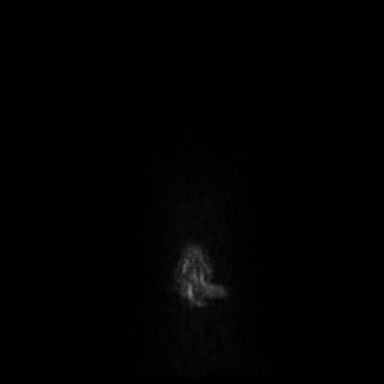
[im 10/40]
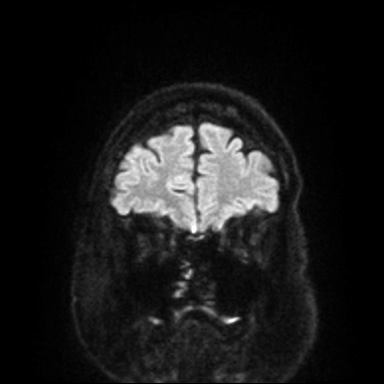
[im 20/40]
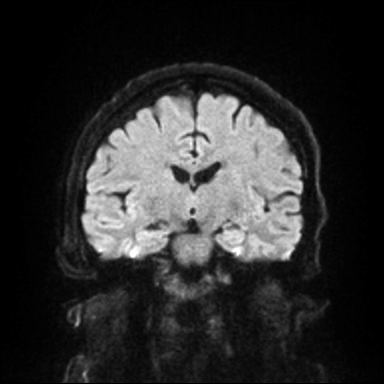
[im 30/40]
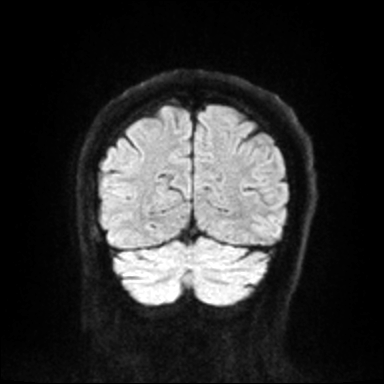
[im 40/40]
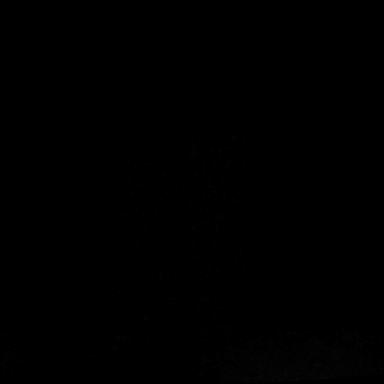

[Series 8: cor dwi_adc · coronal · 5.0mm · 0.60mm/px · 5 of 38 slices shown]
[im 1/38]
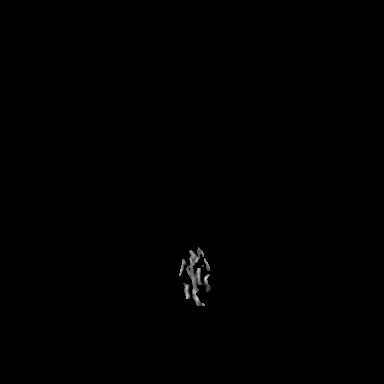
[im 10/38]
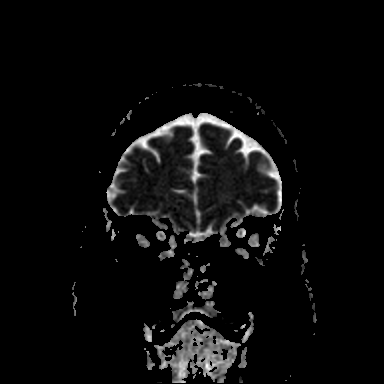
[im 19/38]
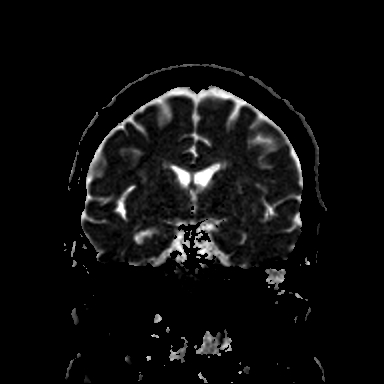
[im 28/38]
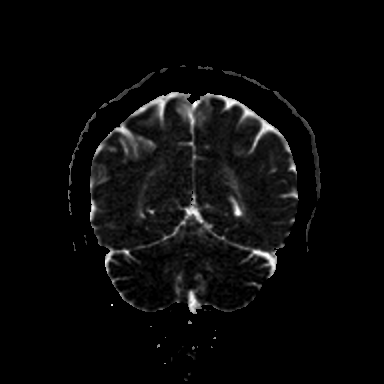
[im 38/38]
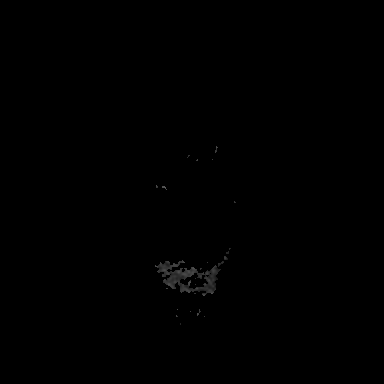

[Series 9: T1 post-contrast · axial · 1.0mm · 0.98mm/px · z∈[-116,+47]mm · 18 of 174 slices shown (1 of 3)]
[im 1/174]
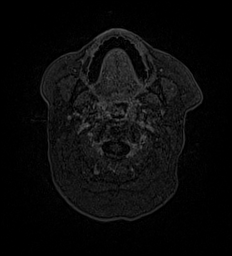
[im 9/174]
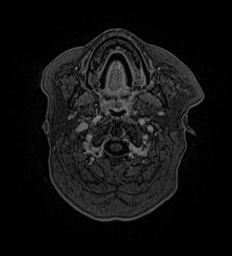
[im 18/174]
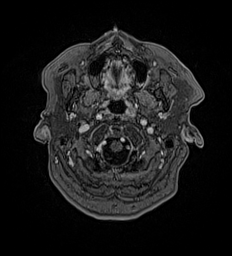
[im 26/174]
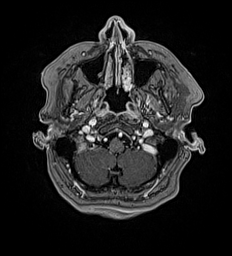
[im 35/174]
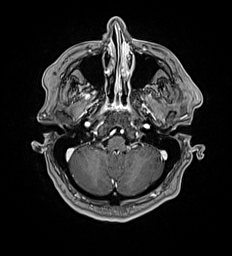
[im 44/174]
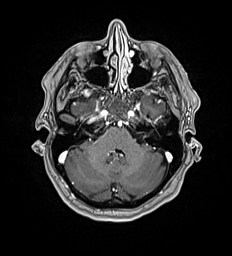
[im 52/174]
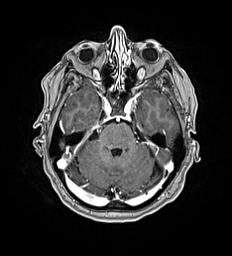
[im 61/174]
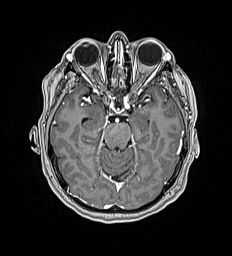
[im 70/174]
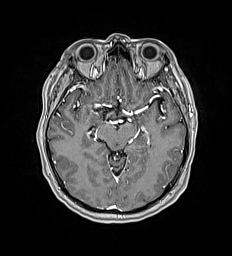
[im 78/174]
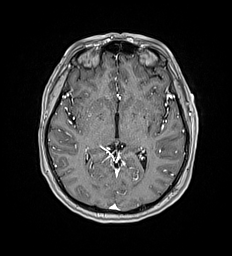
[im 87/174]
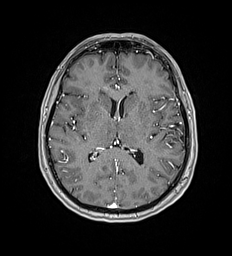
[im 96/174]
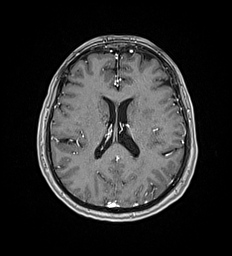
[im 104/174]
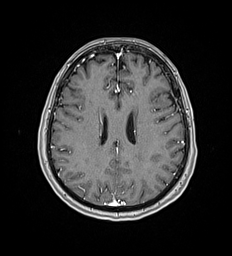
[im 113/174]
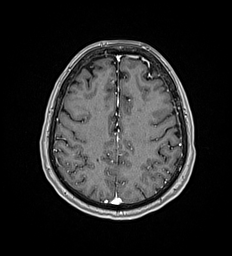
[im 122/174]
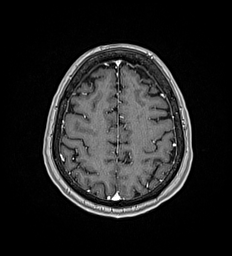
[im 130/174]
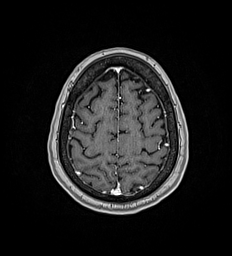
[im 148/174]
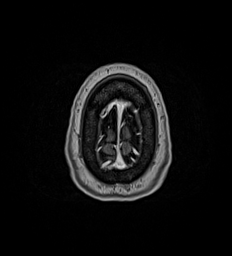
[im 165/174]
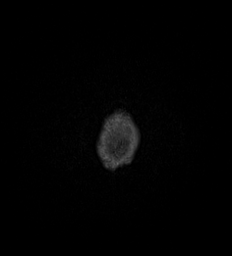

[Series 10: T1 post-contrast · coronal · 5.0mm · 0.57mm/px · 3 of 29 slices shown (2 of 3)]
[im 1/29]
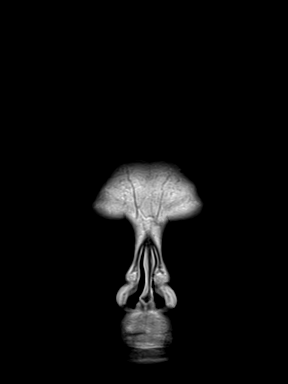
[im 15/29]
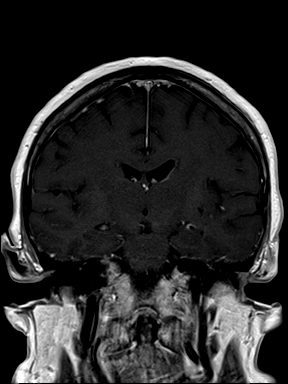
[im 29/29]
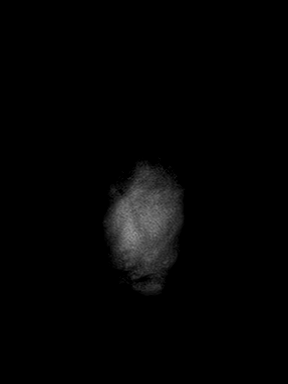

[Series 11: T1 post-contrast · sagittal · 5.0mm · 0.62mm/px · 3 of 25 slices shown (3 of 3)]
[im 1/25]
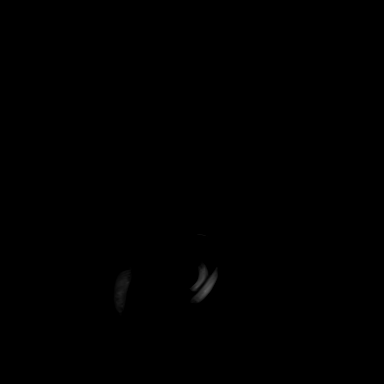
[im 13/25]
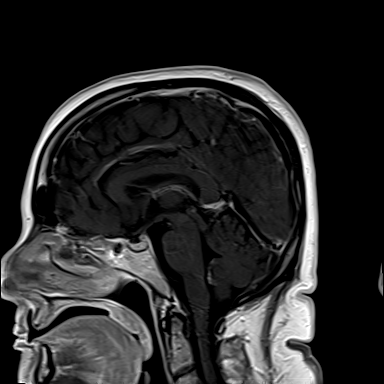
[im 25/25]
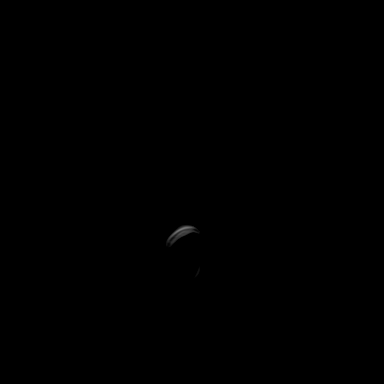

[45 of 48 positions shown; findings below may reference images not displayed]

FINDINGS: Brain: Repeat diffusion imaging does not show any acute or subacute
infarction. Postcontrast imaging is normal. No abnormal enhancement
of the brain or leptomeninges. No visible orbital pathology as seen.

Vascular: Major vessels at the base of the brain show flow.

Skull and upper cervical spine: Negative

Sinuses/Orbits: Clear/normal

Other: None
IMPRESSION: Normal postcontrast imaging. No abnormality seen to explain the
presenting symptoms.

## 2020-07-09 MED ORDER — HYDROXYZINE HCL 25 MG PO TABS
25.0000 mg | ORAL_TABLET | Freq: Three times a day (TID) | ORAL | Status: DC | PRN
  Filled 2020-07-09: qty 1

## 2020-07-09 MED ORDER — ASPIRIN 325 MG PO TABS
325.0000 mg | ORAL_TABLET | Freq: Every day | ORAL | Status: DC
  Administered 2020-07-10 – 2020-07-15 (×6): 325 mg via ORAL
  Filled 2020-07-09 (×7): qty 1

## 2020-07-09 MED ORDER — FLUTICASONE PROPIONATE 50 MCG/ACT NA SUSP
1.0000 | Freq: Every day | NASAL | Status: DC
  Administered 2020-07-11 – 2020-07-15 (×5): 1 via NASAL
  Filled 2020-07-09: qty 16

## 2020-07-09 MED ORDER — CARVEDILOL 3.125 MG PO TABS: 3.1250 mg | ORAL_TABLET | Freq: Two times a day (BID) | ORAL | Status: DC

## 2020-07-09 MED ORDER — GADOBUTROL 1 MMOL/ML IV SOLN
8.0000 mL | Freq: Once | INTRAVENOUS | Status: AC | PRN
  Administered 2020-07-09: 22:00:00 8 mL via INTRAVENOUS

## 2020-07-09 MED ORDER — HYDROCHLOROTHIAZIDE 12.5 MG PO CAPS
12.5000 mg | ORAL_CAPSULE | Freq: Every day | ORAL | Status: DC
  Administered 2020-07-10 – 2020-07-14 (×5): 12.5 mg via ORAL
  Filled 2020-07-09 (×5): qty 1

## 2020-07-09 MED ORDER — B COMPLEX PO TABS: 1.0000 | ORAL_TABLET | Freq: Every day | ORAL | Status: DC

## 2020-07-09 MED ORDER — B COMPLEX-C PO TABS
1.0000 | ORAL_TABLET | Freq: Every day | ORAL | Status: DC
  Administered 2020-07-10 – 2020-07-15 (×6): 1 via ORAL
  Filled 2020-07-09 (×7): qty 1

## 2020-07-09 MED ORDER — ACETAMINOPHEN 325 MG PO TABS
650.0000 mg | ORAL_TABLET | ORAL | Status: DC | PRN
  Administered 2020-07-09 – 2020-07-15 (×13): 650 mg via ORAL
  Filled 2020-07-09 (×13): qty 2

## 2020-07-09 MED ORDER — ACETAMINOPHEN 650 MG RE SUPP: 650.0000 mg | RECTAL | Status: DC | PRN

## 2020-07-09 MED ORDER — IRBESARTAN 150 MG PO TABS
300.0000 mg | ORAL_TABLET | Freq: Every day | ORAL | Status: DC
  Administered 2020-07-10 – 2020-07-14 (×5): 300 mg via ORAL
  Filled 2020-07-09 (×5): qty 2

## 2020-07-09 MED ORDER — STROKE: EARLY STAGES OF RECOVERY BOOK: Freq: Once | Status: AC

## 2020-07-09 MED ORDER — MONTELUKAST SODIUM 10 MG PO TABS
10.0000 mg | ORAL_TABLET | Freq: Every day | ORAL | Status: DC
  Administered 2020-07-09 – 2020-07-14 (×6): 10 mg via ORAL
  Filled 2020-07-09 (×6): qty 1

## 2020-07-09 MED ORDER — METHYLPHENIDATE HCL 10 MG PO TABS
10.0000 mg | ORAL_TABLET | Freq: Two times a day (BID) | ORAL | Status: DC
  Administered 2020-07-10 – 2020-07-15 (×7): 10 mg via ORAL
  Filled 2020-07-09 (×12): qty 1

## 2020-07-09 MED ORDER — ALBUTEROL SULFATE HFA 108 (90 BASE) MCG/ACT IN AERS
1.0000 | INHALATION_SPRAY | Freq: Four times a day (QID) | RESPIRATORY_TRACT | Status: DC | PRN
  Filled 2020-07-09: qty 6.7

## 2020-07-09 MED ORDER — INSULIN GLARGINE 100 UNIT/ML ~~LOC~~ SOLN
50.0000 [IU] | Freq: Every day | SUBCUTANEOUS | Status: DC
  Administered 2020-07-09 – 2020-07-14 (×6): 50 [IU] via SUBCUTANEOUS
  Filled 2020-07-09 (×7): qty 0.5

## 2020-07-09 MED ORDER — ACETAMINOPHEN 160 MG/5ML PO SOLN
650.0000 mg | ORAL | Status: DC | PRN
  Filled 2020-07-09: qty 20.3

## 2020-07-09 MED ORDER — OLMESARTAN MEDOXOMIL-HCTZ 40-12.5 MG PO TABS: 1.0000 | ORAL_TABLET | Freq: Every day | ORAL | Status: DC

## 2020-07-09 MED ORDER — VITAMIN D3 25 MCG (1000 UNIT) PO TABS
1000.0000 [IU] | ORAL_TABLET | Freq: Every day | ORAL | Status: DC
  Administered 2020-07-10 – 2020-07-15 (×6): 1000 [IU] via ORAL
  Filled 2020-07-09 (×11): qty 1

## 2020-07-09 MED ORDER — FLUOXETINE HCL 20 MG PO CAPS
20.0000 mg | ORAL_CAPSULE | Freq: Three times a day (TID) | ORAL | Status: DC
  Administered 2020-07-09 – 2020-07-15 (×17): 20 mg via ORAL
  Filled 2020-07-09 (×20): qty 1

## 2020-07-09 MED ORDER — AMLODIPINE BESYLATE 5 MG PO TABS
5.0000 mg | ORAL_TABLET | Freq: Every day | ORAL | Status: DC
  Administered 2020-07-09 – 2020-07-12 (×4): 5 mg via ORAL
  Filled 2020-07-09 (×4): qty 1

## 2020-07-09 MED ORDER — ASCORBIC ACID 500 MG PO TABS
1000.0000 mg | ORAL_TABLET | Freq: Every day | ORAL | Status: DC
  Administered 2020-07-10 – 2020-07-13 (×4): 1000 mg via ORAL
  Administered 2020-07-14: 500 mg via ORAL
  Administered 2020-07-15: 08:00:00 1000 mg via ORAL
  Filled 2020-07-09 (×6): qty 2

## 2020-07-09 NOTE — ED Notes (Signed)
Pt in bathroom

## 2020-07-09 NOTE — Progress Notes (Signed)
Received pt to room 122 from ED. Pt A/ox4. No signs of distress noted. VSS. Oriented pt to room and the use of the call bell. Placed all items within reach. Will continue with the plan of care.

## 2020-07-09 NOTE — ED Triage Notes (Signed)
Pt c/o stumbling a lot , having trouble with her gait for the past 3 weeks, states this morning when she woke up she was having blurred vision, but states when she covers one eye at a time her vision is fine but with both eyes open it is blurry "it weird I cant explain it".Marland Kitchen
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
## 2020-07-09 NOTE — ED Notes (Signed)
Pt given charger for phone

## 2020-07-09 NOTE — ED Notes (Signed)
Pt given cup of black coffee

## 2020-07-09 NOTE — ED Notes (Signed)
Neurologist at bedside. 

## 2020-07-09 NOTE — ED Notes (Signed)
Dr. Cox at bedside.  
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
## 2020-07-09 NOTE — ED Notes (Signed)
Pt taken for MRI.

## 2020-07-09 NOTE — ED Notes (Signed)
Dr Kinner at bedside. 

## 2020-07-10 DIAGNOSIS — Z794 Long term (current) use of insulin: Secondary | ICD-10-CM | POA: Diagnosis not present

## 2020-07-10 DIAGNOSIS — G7001 Myasthenia gravis with (acute) exacerbation: Secondary | ICD-10-CM | POA: Diagnosis not present

## 2020-07-10 DIAGNOSIS — E1140 Type 2 diabetes mellitus with diabetic neuropathy, unspecified: Secondary | ICD-10-CM | POA: Diagnosis not present

## 2020-07-10 LAB — LIPID PANEL
Cholesterol: 115 mg/dL (ref 0–200)
HDL: 27 mg/dL — ABNORMAL LOW (ref >40)
LDL Cholesterol: 40 mg/dL (ref 0–99)
Total CHOL/HDL Ratio: 4.3 RATIO
Triglycerides: 239 mg/dL — ABNORMAL HIGH (ref <150)
VLDL: 48 mg/dL — ABNORMAL HIGH (ref 0–40)

## 2020-07-10 LAB — TSH: TSH: 1.099 u[IU]/mL (ref 0.350–4.500)

## 2020-07-10 LAB — FOLATE: Folate: 44 ng/mL (ref >5.9)

## 2020-07-10 LAB — VITAMIN B12: Vitamin B-12: 1113 pg/mL — ABNORMAL HIGH (ref 180–914)

## 2020-07-10 LAB — SARS CORONAVIRUS 2 (TAT 6-24 HRS): SARS Coronavirus 2: POSITIVE — AB

## 2020-07-10 LAB — HEMOGLOBIN A1C
Hgb A1c MFr Bld: 7.5 % — ABNORMAL HIGH (ref 4.8–5.6)
Mean Plasma Glucose: 168.55 mg/dL

## 2020-07-10 LAB — VITAMIN D 25 HYDROXY (VIT D DEFICIENCY, FRACTURES): Vit D, 25-Hydroxy: 48.19 ng/mL (ref 30–100)

## 2020-07-10 LAB — GLUCOSE, CAPILLARY: Glucose-Capillary: 132 mg/dL — ABNORMAL HIGH (ref 70–99)

## 2020-07-10 MED ORDER — SODIUM CHLORIDE 0.9% FLUSH
3.0000 mL | Freq: Two times a day (BID) | INTRAVENOUS | Status: DC
  Administered 2020-07-10 – 2020-07-15 (×12): 3 mL via INTRAVENOUS

## 2020-07-10 MED ORDER — IMMUNE GLOBULIN (HUMAN) 10 GM/100ML IV SOLN
400.0000 mg/kg | INTRAVENOUS | Status: AC
  Administered 2020-07-10 – 2020-07-14 (×5): 30 g via INTRAVENOUS
  Filled 2020-07-10 (×6): qty 300

## 2020-07-10 MED ORDER — SODIUM CHLORIDE 0.9 % IV SOLN
100.0000 mg | Freq: Every day | INTRAVENOUS | Status: AC
  Administered 2020-07-11 – 2020-07-12 (×3): 100 mg via INTRAVENOUS
  Filled 2020-07-10 (×2): qty 20

## 2020-07-10 MED ORDER — INSULIN ASPART 100 UNIT/ML ~~LOC~~ SOLN
0.0000 [IU] | Freq: Three times a day (TID) | SUBCUTANEOUS | Status: DC
  Administered 2020-07-11: 17:00:00 3 [IU] via SUBCUTANEOUS
  Administered 2020-07-12: 2 [IU] via SUBCUTANEOUS
  Administered 2020-07-12: 17:00:00 1 [IU] via SUBCUTANEOUS
  Administered 2020-07-12: 2 [IU] via SUBCUTANEOUS
  Administered 2020-07-13: 3 [IU] via SUBCUTANEOUS
  Administered 2020-07-13: 14:00:00 2 [IU] via SUBCUTANEOUS
  Administered 2020-07-13: 09:00:00 1 [IU] via SUBCUTANEOUS
  Administered 2020-07-14: 2 [IU] via SUBCUTANEOUS
  Administered 2020-07-14: 18:00:00 1 [IU] via SUBCUTANEOUS
  Administered 2020-07-15: 12:00:00 2 [IU] via SUBCUTANEOUS
  Filled 2020-07-10 (×9): qty 1

## 2020-07-10 MED ORDER — ASCORBIC ACID 500 MG PO TABS: 1000.0000 mg | ORAL_TABLET | Freq: Every day | ORAL | Status: DC

## 2020-07-10 MED ORDER — FOLIC ACID 1 MG PO TABS
1.0000 mg | ORAL_TABLET | Freq: Every day | ORAL | Status: DC
  Administered 2020-07-10 – 2020-07-15 (×6): 1 mg via ORAL
  Filled 2020-07-10 (×6): qty 1

## 2020-07-10 MED ORDER — INSULIN ASPART 100 UNIT/ML ~~LOC~~ SOLN: 0.0000 [IU] | Freq: Every day | SUBCUTANEOUS | Status: DC

## 2020-07-10 MED ORDER — SODIUM CHLORIDE 0.9 % IV SOLN: INTRAVENOUS | Status: DC

## 2020-07-10 MED ORDER — SODIUM CHLORIDE 0.9 % IV SOLN
200.0000 mg | Freq: Once | INTRAVENOUS | Status: AC
  Administered 2020-07-10: 200 mg via INTRAVENOUS
  Filled 2020-07-10: qty 200

## 2020-07-10 NOTE — Progress Notes (Signed)
Subjective: The patient states that her double vision persists. Still with gait unsteadiness. Denies feeling as though she has limb weakness, although exam yesterday revealed mild BLE weakness. MRI completed with no acute findings.   Objective: Current vital signs: BP (!) 141/74 (BP Location: Left Arm)   Pulse 83   Temp 97.8 F (36.6 C) (Oral)   Resp 20   Ht 5\' 2"  (1.575 m)   Wt 79.5 kg   SpO2 94%   BMI 32.06 kg/m  Vital signs in last 24 hours: Temp:  [97.4 F (36.3 C)-98.4 F (36.9 C)] 97.8 F (36.6 C) (02/25 2015) Pulse Rate:  [63-97] 83 (02/25 2015) Resp:  [14-20] 20 (02/25 2015) BP: (126-162)/(53-74) 141/74 (02/25 2015) SpO2:  [93 %-99 %] 94 % (02/25 2015)  Intake/Output from previous day: No intake/output data recorded. Intake/Output this shift: No intake/output data recorded. Nutritional status:  Diet Order            Diet heart healthy/carb modified Room service appropriate? Yes; Fluid consistency: Thin  Diet effective now                HEENT: [place]/AT Lungs: Respirations unlabored Ext: No edema  Neurological Examination Mental Status:  Alert, fully oriented, thought content appropriate.  Speech fluent without evidence of aphasia.  No dysarthria. Able to follow all commands without difficulty. Cranial Nerves: II: Pupils equal. Fixates and tracks normally.  III,IV, VI: Mild bilateral ptosis worsens with sustained upgaze, right worse than left. There is double vision on right lateral and left lateral gaze, as well in central gaze immediately after looking to the left or right. Intermittent esotropia and exotropia with horizontal gaze; left eye tends to lag right. There is diplopia today with upgaze, but none with downgaze. No diplopia when fixating on a close object, but manifests when looking further away. No skew deviation. No nystagmus.  V,VII: Smile is symmetric; no "myasthenic snarl" noted.   VIII: Hearing intact to voice.  IX,X: No hoarseness or hypophonia.   XI: Symmetric XII: No lingual dysarthria  Motor: BUE strength is 5/5. Mild BLE weakness proximally and distally rated as 4-4+/5 and symmetric.  Sensory: Unchanged  Cerebellar: No ataxia noted  Lab Results: Results for orders placed or performed during the hospital encounter of 07/09/20 (from the past 48 hour(s))  Protime-INR     Status: None   Collection Time: 07/09/20 10:19 AM  Result Value Ref Range   Prothrombin Time 12.5 11.4 - 15.2 seconds   INR 1.0 0.8 - 1.2    Comment: (NOTE) INR goal varies based on device and disease states. Performed at [facility], [address] [place], [place] [address]   APTT     Status: None   Collection Time: 07/09/20 10:19 AM  Result Value Ref Range   aPTT 33 24 - 36 seconds    Comment: Performed at [facility], [address] [place], [place] [address]  CBC     Status: Abnormal   Collection Time: 07/09/20 10:19 AM  Result Value Ref Range   WBC 7.5 4.0 - 10.5 K/uL   RBC 5.33 (H) 3.87 - 5.11 MIL/uL   Hemoglobin 14.3 12.0 - 15.0 g/dL   HCT 43.8 36.0 - 46.0 %   MCV 82.2 80.0 - 100.0 fL   MCH 26.8 26.0 - 34.0 pg   MCHC 32.6 30.0 - 36.0 g/dL   RDW 13.6 11.5 - 15.5 %   Platelets 185 150 - 400 K/uL   nRBC 0.0  0.0 - 0.2 %    Comment: Performed at [facility], [address] [place], [place] [address]  Differential     Status: None   Collection Time: 07/09/20 10:19 AM  Result Value Ref Range   Neutrophils Relative % 59 %   Neutro Abs 4.4 1.7 - 7.7 K/uL   Lymphocytes Relative 33 %   Lymphs Abs 2.5 0.7 - 4.0 K/uL   Monocytes Relative 8 %   Monocytes Absolute 0.6 0.1 - 1.0 K/uL   Eosinophils Relative 0 %   Eosinophils Absolute 0.0 0.0 - 0.5 K/uL   Basophils Relative 0 %   Basophils Absolute 0.0 0.0 - 0.1 K/uL   Immature Granulocytes 0 %   Abs Immature Granulocytes 0.03 0.00 - 0.07 K/uL    Comment: Performed at [facility], [address] [place], [place] [address]  Comprehensive metabolic  panel     Status: Abnormal   Collection Time: 07/09/20 10:19 AM  Result Value Ref Range   Sodium 137 135 - 145 mmol/L   Potassium 4.0 3.5 - 5.1 mmol/L   Chloride 102 98 - 111 mmol/L   CO2 25 22 - 32 mmol/L   Glucose, Bld 138 (H) 70 - 99 mg/dL    Comment: Glucose reference range applies only to samples taken after fasting for at least 8 hours.   BUN 18 8 - 23 mg/dL   Creatinine, Ser 0.74 0.44 - 1.00 mg/dL   Calcium 9.2 8.9 - 10.3 mg/dL   Total Protein 7.6 6.5 - 8.1 g/dL   Albumin 4.4 3.5 - 5.0 g/dL   AST 27 15 - 41 U/L   ALT 28 0 - 44 U/L   Alkaline Phosphatase 64 38 - 126 U/L   Total Bilirubin 1.1 0.3 - 1.2 mg/dL   GFR, Estimated >60 >60 mL/min    Comment: (NOTE) Calculated using the CKD-EPI Creatinine Equation (2021)    Anion gap 10 5 - 15    Comment: Performed at [facility], [address] [place], [place] [address]  SARS CORONAVIRUS 2 (TAT 6-24 HRS) Nasopharyngeal Nasopharyngeal Swab     Status: Abnormal   Collection Time: 07/09/20  2:30 PM   Specimen: Nasopharyngeal Swab  Result Value Ref Range   SARS Coronavirus 2 POSITIVE (A) NEGATIVE    Comment: (NOTE) SARS-CoV-2 target nucleic acids are DETECTED.  The SARS-CoV-2 RNA is generally detectable in upper and lower respiratory specimens during the acute phase of infection. Positive results are indicative of the presence of SARS-CoV-2 RNA. Clinical correlation with patient history and other diagnostic information is  necessary to determine patient infection status. Positive results do not rule out bacterial infection or co-infection with other viruses.  The expected result is Negative.  Fact Sheet for Patients: SugarRoll.be  Fact Sheet for Healthcare Providers: https://www.woods-mathews.com/  This test is not yet approved or cleared by the [place] FDA and  has been authorized for detection and/or diagnosis of SARS-CoV-2 by FDA under an Emergency Use  Authorization (EUA). This EUA will remain  in effect (meaning this test can be used) for the duration of the COVID-19 declaration under Section 564(b)(1) of the Act, 21 U. S.C. section 360bbb-3(b)(1), unless the authorization is terminated or revoked sooner.   Performed at [facility], [place] [address] [place], [place] [address]   Glucose, capillary     Status: Abnormal   Collection Time: 07/09/20 10:52 PM  Result Value Ref Range   Glucose-Capillary 166 (H) 70 -  99 mg/dL    Comment: Glucose reference range applies only to samples taken after fasting for at least 8 hours.  Hemoglobin A1c     Status: Abnormal   Collection Time: 07/10/20  5:34 AM  Result Value Ref Range   Hgb A1c MFr Bld 7.5 (H) 4.8 - 5.6 %    Comment: (NOTE) Pre diabetes:          5.7%-6.4%  Diabetes:              >6.4%  Glycemic control for   <7.0% adults with diabetes    Mean Plasma Glucose 168.55 mg/dL    Comment: Performed at [facility] [address], [place], [place] [address]  Lipid panel     Status: Abnormal   Collection Time: 07/10/20  5:34 AM  Result Value Ref Range   Cholesterol 115 0 - 200 mg/dL   Triglycerides 239 (H) <150 mg/dL   HDL 27 (L) >40 mg/dL   Total CHOL/HDL Ratio 4.3 RATIO   VLDL 48 (H) 0 - 40 mg/dL   LDL Cholesterol 40 0 - 99 mg/dL    Comment:        Total Cholesterol/HDL:CHD Risk Coronary Heart Disease Risk Table                     Men   Women  1/2 Average Risk   3.4   3.3  Average Risk       5.0   4.4  2 X Average Risk   9.6   7.1  3 X Average Risk  23.4   11.0        Use the calculated Patient Ratio above and the CHD Risk Table to determine the patient's CHD Risk.        ATP III CLASSIFICATION (LDL):  <100     mg/dL   Optimal  100-129  mg/dL   Near or Above                    Optimal  130-159  mg/dL   Borderline  160-189  mg/dL   High  >190     mg/dL   Very High Performed at [facility], [address] [place], [place] [address]    VITAMIN D 25 Hydroxy (Vit-D Deficiency, Fractures)     Status: None   Collection Time: 07/10/20  5:34 AM  Result Value Ref Range   Vit D, 25-Hydroxy 48.19 30 - 100 ng/mL    Comment: (NOTE) Vitamin D deficiency has been defined by the [place] practice guideline as a level of serum 25-OH  vitamin D less than 20 ng/mL (1,2). The Endocrine Society went on to  further define vitamin D insufficiency as a level between 21 and 29  ng/mL (2).  1. IOM (Institute of Medicine). 2010. Dietary reference intakes for  calcium and D. [place]: The [employer]. 2. Holick MF, Binkley Dunnigan, Bischoff-Ferrari HA, et al. Evaluation,  treatment, and prevention of vitamin D deficiency: an Endocrine  Society clinical practice guideline, JCEM. 2011 Jul; 96(7): 1911-30.  Performed at [facility], [place] [address], [place], [place] [address]   Vitamin B12     Status: Abnormal   Collection Time: 07/10/20  9:32 AM  Result Value Ref Range   Vitamin B-12 1,113 (H) 180 - 914 pg/mL    Comment: (NOTE) This assay is not validated for testing neonatal or  myeloproliferative syndrome specimens for Vitamin B12 levels. Performed at [facility], [place] [address], [place], [place] [address]   Folate     Status: None   Collection Time: 07/10/20  9:32 AM  Result Value Ref Range   Folate 44.0 >5.9 ng/mL    Comment: Performed at [facility], [address] [place], [place] [address]  TSH     Status: None   Collection Time: 07/10/20  9:32 AM  Result Value Ref Range   TSH 1.099 0.350 - 4.500 uIU/mL    Comment: Performed by a 3rd Generation assay with a functional sensitivity of <=0.01 uIU/mL. Performed at [facility], [address], [place], [place] [address]     Recent Results (from the past 240 hour(s))  SARS CORONAVIRUS 2 (TAT 6-24 HRS) Nasopharyngeal Nasopharyngeal Swab     Status: Abnormal   Collection Time: 07/09/20  2:30  PM   Specimen: Nasopharyngeal Swab  Result Value Ref Range Status   SARS Coronavirus 2 POSITIVE (A) NEGATIVE Final    Comment: (NOTE) SARS-CoV-2 target nucleic acids are DETECTED.  The SARS-CoV-2 RNA is generally detectable in upper and lower respiratory specimens during the acute phase of infection. Positive results are indicative of the presence of SARS-CoV-2 RNA. Clinical correlation with patient history and other diagnostic information is  necessary to determine patient infection status. Positive results do not rule out bacterial infection or co-infection with other viruses.  The expected result is Negative.  Fact Sheet for Patients: SugarRoll.be  Fact Sheet for Healthcare Providers: https://www.woods-mathews.com/  This test is not yet approved or cleared by the [place] FDA and  has been authorized for detection and/or diagnosis of SARS-CoV-2 by FDA under an Emergency Use Authorization (EUA). This EUA will remain  in effect (meaning this test can be used) for the duration of the COVID-19 declaration under Section 564(b)(1) of the Act, 21 U. S.C. section 360bbb-3(b)(1), unless the authorization is terminated or revoked sooner.   Performed at [facility], [place] [address], [place],  [address]     Lipid Panel Recent Labs    07/10/20 0534  CHOL 115  TRIG 239*  HDL 27*  CHOLHDL 4.3  VLDL 48*  LDLCALC 40    Studies/Results: CT HEAD WO CONTRAST  Result Date: 07/09/2020 CLINICAL DATA:  Neuro deficit, acute, stroke suspected. Additional history provided: Stumbling, gait difficulty for 3 weeks, blurred vision this morning. EXAM: CT HEAD WITHOUT CONTRAST TECHNIQUE: Contiguous axial images were obtained from the base of the skull through the vertex without intravenous contrast. COMPARISON:  CT angiogram head/neck 07/19/2019. Brain MRI 07/19/2019. FINDINGS: Brain: Cerebral volume is normal for age. Mild patchy and  ill-defined hypoattenuation within the cerebral white matter is nonspecific, but compatible with chronic small vessel ischemic disease. Redemonstrated bilateral basal ganglia calcifications. There is no acute intracranial hemorrhage. No demarcated cortical infarct. No extra-axial fluid collection. No evidence of intracranial mass. No midline shift. Vascular: No hyperdense vessel.  Atherosclerotic calcifications. Skull: Normal. Negative for fracture or focal suspicious osseous lesion. Sinuses/Orbits: Visualized orbits show no acute finding. No significant paranasal sinus disease at the imaged levels. IMPRESSION: No evidence of acute intracranial abnormality. Stable mild cerebral white matter chronic small vessel ischemic disease. Electronically Signed   By: Kellie Simmering DO   On: 07/09/2020 10:48   MR BRAIN WO CONTRAST  Result Date: 07/09/2020 CLINICAL DATA:  Diplopia. EXAM: MRI HEAD WITHOUT CONTRAST TECHNIQUE: Multiplanar, multiecho pulse sequences of the brain and surrounding structures  were obtained without intravenous contrast. COMPARISON:  Head CT 07/09/2020. CT angiogram head/neck 07/19/2019. Brain MRI 07/19/2019. FINDINGS: Brain: Cerebral volume is normal for age. Moderate multifocal T2/FLAIR hyperintensity within the cerebral white matter and pons is nonspecific, but compatible with chronic small vessel ischemic disease. There are a few scattered nonspecific chronic microhemorrhages within the right frontal lobe which are new as compared to the prior brain MRI of 07/19/2019. There is no acute infarct. No evidence of intracranial mass. No extra-axial fluid collection. No midline shift. Vascular: Expected proximal arterial flow voids. Skull and upper cervical spine: No focal marrow lesion. Sinuses/Orbits: Visualized orbits show no acute finding. Trace bilateral ethmoid sinus and left maxillary sinus mucosal thickening. IMPRESSION: No evidence of acute intracranial abnormality. Moderate chronic small vessel  ischemic disease within the cerebral white matter and pons, stable from the brain MRI of 07/19/2019. There are a few scattered chronic microhemorrhages within the right frontal lobe, which are new from the prior brain MRI. Electronically Signed   By: Kellie Simmering DO   On: 07/09/2020 14:29   MR BRAIN W CONTRAST  Result Date: 07/09/2020 CLINICAL DATA:  Diplopia. EXAM: MRI HEAD WITH CONTRAST TECHNIQUE: Multiplanar, multiecho pulse sequences of the brain and surrounding structures were obtained with intravenous contrast. CONTRAST:  96mL GADAVIST GADOBUTROL 1 MMOL/ML IV SOLN COMPARISON:  Noncontrast study earlier same day. FINDINGS: Brain: Repeat diffusion imaging does not show any acute or subacute infarction. Postcontrast imaging is normal. No abnormal enhancement of the brain or leptomeninges. No visible orbital pathology as seen. Vascular: Major vessels at the base of the brain show flow. Skull and upper cervical spine: Negative Sinuses/Orbits: Clear/normal Other: None IMPRESSION: Normal postcontrast imaging. No abnormality seen to explain the presenting symptoms. Electronically Signed   By: Nelson Chimes M.D.   On: 07/09/2020 22:14    Medications:  Scheduled: . amLODipine  5 mg Oral QHS  . ascorbic acid  1,000 mg Oral Daily  . aspirin  325 mg Oral Daily  . B-complex with vitamin C  1 tablet Oral Daily  . cholecalciferol  1,000 Units Oral Daily  . FLUoxetine  20 mg Oral TID  . fluticasone  1 spray Each Nare Daily  . folic acid  1 mg Oral Daily  . irbesartan  300 mg Oral Daily   And  . hydrochlorothiazide  12.5 mg Oral Daily  . insulin aspart  0-5 Units Subcutaneous QHS  . [START ON 07/11/2020] insulin aspart  0-9 Units Subcutaneous TID WC  . insulin glargine  50 Units Subcutaneous QHS  . methylphenidate  10 mg Oral BID  . montelukast  10 mg Oral QHS  . sodium chloride flush  3 mL Intravenous Q12H   Continuous: . [START ON 07/11/2020] remdesivir 100 mg in NS 100 mL     Prior CTA head (March  2021): 1. Intracranially, the right ICA remains asymmetrically diminutive and with asymmetrically decreased opacification as compared to the left. This is likely related to the high-grade right ICA stenosis within the neck. No evidence of dissection within the proximal right ICA siphon. 2. No intracranial large vessel occlusion or high-grade proximal arterial stenosis. 3. Calcified plaque within the ICA siphons bilaterally with sites of up to mild/moderate stenosis on the right and no more than mild stenosis on the left. 4. Subtle nonspecific fusiform dilation of the proximal A2 right ACA without saccular aneurysm.  Prior CTA neck (March 2021): 1. Prominent mixed plaque results in apparent severe stenosis within the proximal right ICA. Exact  quantification of stenosis is difficult due to blooming of prominent calcified plaque. Distal to this, the right ICA is asymmetrically diminutive and with asymmetrically decreased enhancement, although patent. 2. The left CCA and ICA are patent within the neck. Calcified plaque results in less than 50% stenosis of the proximal ICA. 3. The vertebral arteries are patent within the neck without stenosis.   Assessment: 75 year old female with a known history of myasthenia gravis (diagnosed 1 year ago by antibodies), not on Mestinon or immunomodulatory agent, presenting with a 3 week history of stumbling gait and subjective unsteadiness followed by acute onset of binocular diplopia. She has PVD with significant atherosclerotic changes seen on CTA of head and neck from last year. She is s/p right ICA stenting last year. Although not listed in Epic, she also endorses a procedure consistent with possible prior bilateral iliac stenting for BLE intermittent claudication symptoms.  1. MRI has ruled out stroke. Presentation most likely secondary to exacerbation myasthenia gravis with ocular manifestations. Mild/subtle bilateral lower extremity weakness on exam may  also be secondary to her MG.  2. CT head: No evidence of acute intracranial abnormality. Stable mild cerebral white matter chronic small vessel ischemic disease.  3. MRI brain: No evidence of acute intracranial abnormality. 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 There are a few scattered chronic microhemorrhages within the right frontal lobe, which are new from the prior brain MRI. Post contrast images are negative.  4. She has tested positive for Covid but is asymptomatic.   Recommendations: 1. See initial consult note for a detailed list of medications to avoid in MG. Several of her home meds are known to result in worsened muscle strength in MG and have been held during this admission.  2. Discussed with the patient and her son options for treatment of probable myasthenia gravis exacerbation. The patient elects to be treated with IVIG after extensive review of risks/benefits and discussion of alternate treatments, versus no treatment. A five day course of IVIG has been ordered. To reduce the risk of blood clots, IV NS for 24 hours has been ordered. Reassess and continue IVF each day if no evidence for volume overload. Monitor potassium levels.  3. Daily NIF and FVC with RT. Please document in Progress Notes.  4. Neurology will continue to follow with you.    LOS: 0 days   @Electronically  signed: Dr. Kerney Elbe 07/10/2020  9:28 PM

## 2020-07-10 NOTE — Evaluation (Signed)
Physical Therapy Evaluation Patient Details Name: Jennifer Shelton MRN: [id number] DOB: July 25, 1945 Today's Date: 07/10/2020   History of Present Illness  Jennifer Shelton is a 75 y.o. female with medical history significant for hyperlipidemia, IBS, PAD, insulin-dependent diabetes mellitus, hypertension, right carotid stenosis status post in 08/05/2019, depression, GERD, PAD, myasthenia gravis diagnosed about 1 year ago, presented to the emergency department for chief concerns of imbalance for 3 to 4 weeks.  She reports that whenever she ambulates she feels unsteady on her foot and wants to walk in a straight line or towards the right. She denies numbness tingling of her bilateral upper extremity or facial droop and denies numbness and tingling of her face. She endorses ringing in [place]..  She reports all of this started approximately end of January 2022. She denies fever, chills, shortness of breath, chest pain, abdominal pain, diarrhea, dysuria, hematuria.  She denies changes to her diet.  She denies head trauma the last 6 months.  Clinical Impression  Patient received in bed. Agrees to PT assessment. Patient slightly impulsive and demonstrates decreased safety awareness. Patient is independent with bed mobility and transfers. Ambulated initially without ad, unsteady, requiring at least single UE support to maintain balance. Ambulated with walker with improved balance and safety demonstrated. Patient has poor static standing without UE support as well. She will continue to benefit from skilled PT while here to work on balance and safety with mobility.     Follow Up Recommendations Outpatient PT    Equipment Recommendations  Rolling walker with 5" wheels    Recommendations for Other Services       Precautions / Restrictions Precautions Precautions: Fall Precaution Comments: mod fall risk; airborne and contact precautions Restrictions Weight Bearing Restrictions: No      Mobility  Bed  Mobility Overal bed mobility: Independent                  Transfers Overall transfer level: Independent                  Ambulation/Gait Ambulation/Gait assistance: Min guard Gait Distance (Feet): 60 Feet Assistive device: Rolling walker (2 wheeled) Gait Pattern/deviations: Step-through pattern;Decreased step length - right;Decreased step length - left;Staggering left;Staggering right Gait velocity: decr   General Gait Details: patient ambulated 30 feet without ad, unsteady staggering to left and right and reaching out for walls, furniture. Ambulated another 30 feet with walker, much improved safety.  Stairs            Wheelchair Mobility    Modified Rankin (Stroke Patients Only)       Balance Overall balance assessment: Needs assistance Sitting-balance support: Feet supported Sitting balance-Leahy Scale: Normal     Standing balance support: During functional activity;No upper extremity supported;Bilateral upper extremity supported Standing balance-Leahy Scale: Poor Standing balance comment: poor balance without B UE support. Single Leg Stance - Right Leg: 1 Single Leg Stance - Left Leg: 1           High Level Balance Comments: unable to maintain standing with feet together, semi tandem, tandem, normal stance with eyes closed without UE support.             Pertinent Vitals/Pain Pain Assessment: No/denies pain    Home Living Family/patient expects to be discharged to:: Private residence Living Arrangements: Alone Available Help at Discharge: Family;Friend(s);Available PRN/intermittently Type of Home: Apartment Home Access: Level entry     Home Layout: One level Home Equipment: Cane - single point;Hand held shower head;Grab  bars - tub/shower;Grab bars - toilet;Shower seat Additional Comments: reports always sits down while showering    Prior Function Level of Independence: Independent         Comments: drives, active at her  church     Hand Dominance   Dominant Hand: Right    Extremity/Trunk Assessment   Upper Extremity Assessment Upper Extremity Assessment: Defer to OT evaluation    Lower Extremity Assessment Lower Extremity Assessment: Overall WFL for tasks assessed    Cervical / Trunk Assessment Cervical / Trunk Assessment: Normal  Communication   Communication: No difficulties  Cognition Arousal/Alertness: Awake/alert Behavior During Therapy: WFL for tasks assessed/performed Overall Cognitive Status: Within Functional Limits for tasks assessed                                        General Comments      Exercises Other Exercises Other Exercises: Educ re: stroke awareness, POC. Therex. Feeding, Grooming   Assessment/Plan    PT Assessment Patient needs continued PT services  PT Problem List Decreased balance;Decreased safety awareness       PT Treatment Interventions Therapeutic activities;Gait training;Stair training;Functional mobility training;Balance training;Therapeutic exercise;Patient/family education    PT Goals (Current goals can be found in the Care Plan section)  Acute Rehab PT Goals Patient Stated Goal: to improve balance PT Goal Formulation: With patient Time For Goal Achievement: 07/24/20 Potential to Achieve Goals: Fair    Frequency     Barriers to discharge Decreased caregiver support      Co-evaluation               AM-PAC PT "6 Clicks" Mobility  Outcome Measure Help needed turning from your back to your side while in a flat bed without using bedrails?: None Help needed moving from lying on your back to sitting on the side of a flat bed without using bedrails?: None Help needed moving to and from a bed to a chair (including a wheelchair)?: A Little Help needed standing up from a chair using your arms (e.g., wheelchair or bedside chair)?: None Help needed to walk in hospital room?: A Little Help needed climbing 3-5 steps with a  railing? : A Little 6 Click Score: 21    End of Session Equipment Utilized During Treatment: Gait belt Activity Tolerance: Patient tolerated treatment well Patient left: in bed;with call bell/phone within reach;with bed alarm set Nurse Communication: Mobility status PT Visit Diagnosis: Unsteadiness on feet (R26.81);Difficulty in walking, not elsewhere classified (R26.2)    Time: 1115-1140 PT Time Calculation (min) (ACUTE ONLY): 25 min   Charges:   PT Evaluation $PT Eval Moderate Complexity: 1 Mod PT Treatments $Gait Training: 8-22 mins        Kristyn Conetta, PT, GCS 07/10/20,11:46 AM

## 2020-07-10 NOTE — TOC Initial Note (Signed)
Transition of Care [facility]) - Initial/Assessment Note    Patient Details  Name: Jennifer Shelton MRN: [id number] Date of Birth: June 29, 1945  Transition of [facility]) CM/SW Contact:    Magnus Ivan, LCSW Phone Number: 07/10/2020, 2:07 PM  Clinical Narrative:        CSW spoke to patient via phone due to Airborne Precautions. Patient lives alone. Drives herself to appointments. PCP is Dr. Sabra Heck. [facility] [place]. No HH or SNF history. Patient has a cane at home. Patient was COVID + on 2/24 so would not be able to go to Outpatient PT. Explained home health option, patient is currently declining home health. Patient says she has a friend who is going to come provide support/care at home when she is discharged. Patient reported she has a friend who can transport her home at time of discharge as well. Patient agreeable to RW recommendation, RW ordered through [place] to be delivered to the bedside.  Expected Discharge Plan: Home/Self Care Barriers to Discharge: Continued Medical Work up   Patient Goals and CMS Choice Patient states their goals for this hospitalization and ongoing recovery are:: home with self care, support from friend CMS Medicare.gov Compare Post Acute Care list provided to:: Patient Choice offered to / list presented to : Patient  Expected Discharge Plan and Services Expected Discharge Plan: Home/Self Care                         DME Arranged: Walker rolling DME Agency: [employer] Date DME Agency Contacted: 07/10/20   Representative spoke with at DME Agency: Nash Shearer            Prior Living Arrangements/Services     Patient language and need for interpreter reviewed:: Yes Do you feel safe going back to the place where you live?: Yes      Need for Family Participation in Patient Care: Yes (Comment) Care giver support system in place?: Yes (comment) Current home services: DME Criminal Activity/Legal Involvement  Pertinent to Current Situation/Hospitalization: No - Comment as needed  Activities of Daily Living Home Assistive Devices/Equipment: None ADL Screening (condition at time of admission) Patient's cognitive ability adequate to safely complete daily activities?: No Is the patient deaf or have difficulty hearing?: No Does the patient have difficulty seeing, even when wearing glasses/contacts?: No Does the patient have difficulty concentrating, remembering, or making decisions?: No Patient able to express need for assistance with ADLs?: Yes Does the patient have difficulty dressing or bathing?: No Independently performs ADLs?: Yes (appropriate for developmental age) Does the patient have difficulty walking or climbing stairs?: No Weakness of Legs: None Weakness of Arms/Hands: None  Permission Sought/Granted Permission sought to share information with : Chartered certified accountant granted to share information with : Yes, Verbal Permission Granted     Permission granted to share info w AGENCY: DME agencies        Emotional Assessment       Orientation: : Oriented to Self,Oriented to Place,Oriented to  Time,Oriented to Situation Alcohol / Substance Use: Not Applicable Psych Involvement: No (comment)  Admission diagnosis:  Myasthenia gravis with exacerbation (HCC) [G70.01] Stroke-like episode [R29.90] Patient Active Problem List   Diagnosis Date Noted  . Stroke-like episode 07/09/2020  . HTN (hypertension) 07/19/2019  . T2DM (type 2 diabetes mellitus) ([place]) 07/19/2019  . Internal carotid artery stenosis, right 07/19/2019  . Ptosis of eyelid, right 07/19/2019  . Hyponatremia 07/19/2019  . Special  screening for malignant neoplasms, colon   . Polyp of sigmoid colon    PCP:  Rusty Aus, MD Pharmacy:   [id number] [place], [place] [place] [address] Phone: [phone] Fax: [phone]     Social Determinants  of Health (SDOH) Interventions    Readmission Risk Interventions No flowsheet data found.

## 2020-07-10 NOTE — Progress Notes (Signed)
SLP Cancellation Note  Patient Details Name: Jennifer Shelton MRN: [id number] DOB: 1945-08-27   Cancelled treatment:       Reason Eval/Treat Not Completed: SLP screened, no needs identified, will sign off (chart reviewed; consulted NSG then met w/ pt). Pt denied any difficulty swallowing and is currently on a regular diet; tolerates swallowing pills w/ water per NSG. Pt fed self breakfast meal post bringing it into the room for her. She performed setup Independently. Pt conversed at conversational level w/out deficits noted; pt denied any speech-language deficits.  No further skilled ST services indicated as pt appears at her baseline. Pt agreed. NSG to reconsult if any change in status.      Orinda Kenner, MS, CCC-SLP Speech Language Pathologist Rehab Services [phone] [facility] 07/10/2020, 9:11 AM

## 2020-07-10 NOTE — Progress Notes (Signed)
Jennifer Shelton  UEA:[id number] DOB: 02-Jan-1946 DOA: 07/09/2020 PCP: Rusty Aus, MD    Brief Narrative:  75 year old who lives independently in a private home with a history of HLD, IBS, PAD, DM 2, HTN, right carotid stenosis status post carotid endarterectomy, depression, GERD, and myasthenia gravis who presented to the ED with 3 to 4 weeks of severe imbalance and gait instability.  In the ER CT head was without evidence of acute stroke.  Significant Events:  2/24 admit via ER  Date of Positive COVID Test:  07/09/20  Vaccination Status: Unvaccinated   COVID-19 specific Treatment: Remdesivir x3   Antimicrobials:  none   DVT prophylaxis: SCDs  Subjective: Resting comfortably at bedside. Feels "about the same." Denies sob or cough. Feels very weak and unstable on her feet.   Assessment & Plan:  Gait instability - rule out CVA versus myasthenia gravis Neurology following - MRI of the head suggests a few scatered chronic microhemorrhages throughout the right frontal lobe but without evidence of acute CVA - Y78 and folic acid levels not low - TSH normal - PT/OT to see   Myasthenia gravis with possible acute flare Medications identified which could potentially exacerbate MG have been decreased or discontinued on suggestion of neurology - Neuro following   Asymptomatic SARS-CoV-2 infection Incidentally found to be positive at time of admission -no current clinical symptoms suggestive of a significant viral infection - pt meets criteria as "high risk" - discussed 3 day course of Remdesivir w/ her and she agrees   DM2 CBG well controlled at this time  HLD LDL 40 this admission -continue usual home medical therapy  HTN Blood pressure presently well controlled  PAD   Code Status: FULL CODE Family Communication:  Status is: Observation  The patient remains OBS appropriate and will d/c before 2 midnights.  Dispo: The patient is from: Home              Anticipated d/c  is to: unclear              Patient currently is not medically stable to d/c.   Difficult to place patient No   Consultants:  Neuro   Objective: Blood pressure 126/64, pulse 75, temperature 97.8 F (36.6 C), resp. rate 18, height 5\' 2"  (1.575 m), weight 79.5 kg, SpO2 99 %. No intake or output data in the 24 hours ending 07/10/20 0918 Filed Weights   07/09/20 1014 07/09/20 2048  Weight: 77.1 kg 79.5 kg    Examination: General: No acute respiratory distress Lungs: Clear to auscultation bilaterally without wheezes or crackles Cardiovascular: Regular rate and rhythm without murmur gallop or rub normal S1 and S2 Abdomen: Nontender, nondistended, soft, bowel sounds positive, no rebound, no ascites, no appreciable mass Extremities: No significant cyanosis, clubbing, or edema bilateral lower extremities  CBC: Recent Labs  Lab 07/09/20 1019  WBC 7.5  NEUTROABS 4.4  HGB 14.3  HCT 43.8  MCV 82.2  PLT 295   Basic Metabolic Panel: Recent Labs  Lab 07/09/20 1019  NA 137  K 4.0  CL 102  CO2 25  GLUCOSE 138*  BUN 18  CREATININE 0.74  CALCIUM 9.2   GFR: Estimated Creatinine Clearance: 62.1 mL/min (by C-G formula based on SCr of 0.74 mg/dL).  Liver Function Tests: Recent Labs  Lab 07/09/20 1019  AST 27  ALT 28  ALKPHOS 64  BILITOT 1.1  PROT 7.6  ALBUMIN 4.4    Coagulation Profile: Recent Labs  Lab 07/09/20 1019  INR 1.0     HbA1C: Hgb A1c MFr Bld  Date/Time Value Ref Range Status  07/19/2019 05:16 PM 6.3 (H) 4.8 - 5.6 % Final    Comment:    (NOTE) Pre diabetes:          5.7%-6.4% Diabetes:              >6.4% Glycemic control for   <7.0% adults with diabetes     CBG: Recent Labs  Lab 07/09/20 2252  GLUCAP 166*    Recent Results (from the past 240 hour(s))  SARS CORONAVIRUS 2 (TAT 6-24 HRS) Nasopharyngeal Nasopharyngeal Swab     Status: Abnormal   Collection Time: 07/09/20  2:30 PM   Specimen: Nasopharyngeal Swab  Result Value Ref Range  Status   SARS Coronavirus 2 POSITIVE (A) NEGATIVE Final    Comment: (NOTE) SARS-CoV-2 target nucleic acids are DETECTED.  The SARS-CoV-2 RNA is generally detectable in upper and lower respiratory specimens during the acute phase of infection. Positive results are indicative of the presence of SARS-CoV-2 RNA. Clinical correlation with patient history and other diagnostic information is  necessary to determine patient infection status. Positive results do not rule out bacterial infection or co-infection with other viruses.  The expected result is Negative.  Fact Sheet for Patients: SugarRoll.be  Fact Sheet for Healthcare Providers: https://www.woods-mathews.com/  This test is not yet approved or cleared by the [place] FDA and  has been authorized for detection and/or diagnosis of SARS-CoV-2 by FDA under an Emergency Use Authorization (EUA). This EUA will remain  in effect (meaning this test can be used) for the duration of the COVID-19 declaration under Section 564(b)(1) of the Act, 21 U. S.C. section 360bbb-3(b)(1), unless the authorization is terminated or revoked sooner.   Performed at [facility], [place] [address] [place], [place] [address]      Scheduled Meds: . amLODipine  5 mg Oral QHS  . ascorbic acid  1,000 mg Oral Daily  . aspirin  325 mg Oral Daily  . B-complex with vitamin C  1 tablet Oral Daily  . cholecalciferol  1,000 Units Oral Daily  . FLUoxetine  20 mg Oral TID  . fluticasone  1 spray Each Nare Daily  . folic acid  1 mg Oral Daily  . irbesartan  300 mg Oral Daily   And  . hydrochlorothiazide  12.5 mg Oral Daily  . insulin glargine  50 Units Subcutaneous QHS  . methylphenidate  10 mg Oral BID  . montelukast  10 mg Oral QHS  . sodium chloride flush  3 mL Intravenous Q12H      LOS: 0 days   Cherene Altes, MD [facility] Office  [phone] Pager - Text Page per Amion  If 7PM-7AM,  please contact night-coverage per Amion 07/10/2020, 9:18 AM

## 2020-07-10 NOTE — Evaluation (Signed)
Occupational Therapy Evaluation Patient Details Name: Jennifer Shelton MRN: [id number] DOB: August 25, 1945 Today's Date: 07/10/2020    History of Present Illness Jennifer Shelton is a 75 y.o. female with medical history significant for hyperlipidemia, IBS, PAD, insulin-dependent diabetes mellitus, hypertension, right carotid stenosis status post in 08/05/2019, depression, GERD, PAD, myasthenia gravis diagnosed about 1 year ago, presented to the emergency department for chief concerns of imbalance for 3 to 4 weeks.  She reports that whenever she ambulates she feels unsteady on her foot and wants to walk in a straight line or towards the right. She denies numbness tingling of her bilateral upper extremity or facial droop and denies numbness and tingling of her face. She endorses ringing in [place]..  She reports all of this started approximately end of January 2022. She denies fever, chills, shortness of breath, chest pain, abdominal pain, diarrhea, dysuria, hematuria.  She denies changes to her diet.  She denies head trauma the last 6 months.   Clinical Impression   [name] was pleasant, talkative, and eager to engage this AM. She presented to ED yesterday with complaints of stumbling gait X 3 weeks and alternating blurred and double vision in her R eye X 1 day. During evaluation today, she did display some difficulties locating objects (e.g., had to make several attempts to pick up fork before successfully making contact with it on her breakfast tray; first undershooting, then overshooting, the utensil), but displayed no unsteadiness and was able to reach outside BOS in sitting and standing without LOB. She denies pain. [name] is IND in all ADL/IADL, she drives, and has numerous social supports in the community. She reports no challenges with functional mobility needs either in her home or community. She does not use any AD and has an accessible apartment. Occupational therapy is not warranted at this time.  Of course we would be happy to re-evaluate this pt, should her condition change. We did discuss contacting her MD earlier if she detects any change in her health, rather than waiting weeks before seeking an appointment.     Follow Up Recommendations  No OT follow up    Equipment Recommendations       Recommendations for Other Services       Precautions / Restrictions Precautions Precautions: None Precaution Comments: mod fall risk; airborne and contact precautions Restrictions Weight Bearing Restrictions: No      Mobility Bed Mobility Overal bed mobility: Independent                  Transfers Overall transfer level: Independent                    Balance Overall balance assessment: Independent                                         ADL either performed or assessed with clinical judgement   ADL Overall ADL's : Independent                                             Vision Patient Visual Report: Diplopia;Blurring of vision Additional Comments: primarily in R eye, began 1 day ago     Perception     Praxis      Pertinent Vitals/Pain Pain Assessment:  No/denies pain     Hand Dominance Right   Extremity/Trunk Assessment Upper Extremity Assessment Upper Extremity Assessment: Overall WFL for tasks assessed   Lower Extremity Assessment Lower Extremity Assessment: Overall WFL for tasks assessed       Communication Communication Communication: No difficulties   Cognition Arousal/Alertness: Awake/alert Behavior During Therapy: WFL for tasks assessed/performed Overall Cognitive Status: Within Functional Limits for tasks assessed                                     General Comments       Exercises Other Exercises Other Exercises: Educ re: stroke awareness, POC. Therex. Feeding, Grooming   Shoulder Instructions      Home Living Family/patient expects to be discharged to:: Private  residence Living Arrangements: Alone Available Help at Discharge: Family;Friend(s);Available PRN/intermittently Type of Home: Apartment Home Access: Level entry     Home Layout: One level     Bathroom Shower/Tub: Teacher, early years/pre: Standard     Home Equipment: Cane - single point;Hand held shower head;Grab bars - tub/shower;Grab bars - toilet;Shower seat   Additional Comments: reports always sits down while showering      Prior Functioning/Environment Level of Independence: Independent        Comments: drives, active at her church        OT Problem List: Impaired vision/perception;Decreased coordination;Impaired balance (sitting and/or standing)      OT Treatment/Interventions:      OT Goals(Current goals can be found in the care plan section) Acute Rehab OT Goals Patient Stated Goal: to go home OT Goal Formulation: With patient Time For Goal Achievement: 07/24/20 Potential to Achieve Goals: Good  OT Frequency:     Barriers to D/C:            Co-evaluation              AM-PAC OT "6 Clicks" Daily Activity     Outcome Measure Help from another person eating meals?: None Help from another person taking care of personal grooming?: None Help from another person toileting, which includes using toliet, bedpan, or urinal?: None Help from another person bathing (including washing, rinsing, drying)?: None Help from another person to put on and taking off regular upper body clothing?: None Help from another person to put on and taking off regular lower body clothing?: None 6 Click Score: 24   End of Session    Activity Tolerance: Patient tolerated treatment well Patient left: in bed;with call bell/phone within reach  OT Visit Diagnosis: Unsteadiness on feet (R26.81);Muscle weakness (generalized) (M62.81)                Time: 8242-3536 OT Time Calculation (min): 24 min Charges:  OT General Charges $OT Visit: 1 Visit OT Evaluation $OT  Eval Low Complexity: 1 Low OT Treatments $Self Care/Home Management : 23-37 mins  Josiah Lobo, PhD, MS, OTR/L ascom [phone] 07/10/20, 9:51 AM

## 2020-07-11 DIAGNOSIS — E785 Hyperlipidemia, unspecified: Secondary | ICD-10-CM | POA: Diagnosis present

## 2020-07-11 DIAGNOSIS — M1990 Unspecified osteoarthritis, unspecified site: Secondary | ICD-10-CM | POA: Diagnosis present

## 2020-07-11 DIAGNOSIS — E1140 Type 2 diabetes mellitus with diabetic neuropathy, unspecified: Secondary | ICD-10-CM | POA: Diagnosis not present

## 2020-07-11 DIAGNOSIS — I10 Essential (primary) hypertension: Secondary | ICD-10-CM | POA: Diagnosis present

## 2020-07-11 DIAGNOSIS — K219 Gastro-esophageal reflux disease without esophagitis: Secondary | ICD-10-CM | POA: Diagnosis present

## 2020-07-11 DIAGNOSIS — G7000 Myasthenia gravis without (acute) exacerbation: Secondary | ICD-10-CM | POA: Diagnosis present

## 2020-07-11 DIAGNOSIS — F32A Depression, unspecified: Secondary | ICD-10-CM | POA: Diagnosis present

## 2020-07-11 DIAGNOSIS — Z7982 Long term (current) use of aspirin: Secondary | ICD-10-CM | POA: Diagnosis not present

## 2020-07-11 DIAGNOSIS — E1165 Type 2 diabetes mellitus with hyperglycemia: Secondary | ICD-10-CM | POA: Diagnosis present

## 2020-07-11 DIAGNOSIS — Z7984 Long term (current) use of oral hypoglycemic drugs: Secondary | ICD-10-CM | POA: Diagnosis not present

## 2020-07-11 DIAGNOSIS — E1142 Type 2 diabetes mellitus with diabetic polyneuropathy: Secondary | ICD-10-CM | POA: Diagnosis present

## 2020-07-11 DIAGNOSIS — Z7952 Long term (current) use of systemic steroids: Secondary | ICD-10-CM | POA: Diagnosis not present

## 2020-07-11 DIAGNOSIS — G8929 Other chronic pain: Secondary | ICD-10-CM | POA: Diagnosis present

## 2020-07-11 DIAGNOSIS — R2689 Other abnormalities of gait and mobility: Secondary | ICD-10-CM | POA: Diagnosis present

## 2020-07-11 DIAGNOSIS — M797 Fibromyalgia: Secondary | ICD-10-CM | POA: Diagnosis present

## 2020-07-11 DIAGNOSIS — U071 COVID-19: Secondary | ICD-10-CM | POA: Diagnosis present

## 2020-07-11 DIAGNOSIS — G7001 Myasthenia gravis with (acute) exacerbation: Secondary | ICD-10-CM | POA: Diagnosis present

## 2020-07-11 DIAGNOSIS — M25551 Pain in right hip: Secondary | ICD-10-CM | POA: Diagnosis present

## 2020-07-11 DIAGNOSIS — K589 Irritable bowel syndrome without diarrhea: Secondary | ICD-10-CM | POA: Diagnosis present

## 2020-07-11 DIAGNOSIS — I6521 Occlusion and stenosis of right carotid artery: Secondary | ICD-10-CM | POA: Diagnosis present

## 2020-07-11 DIAGNOSIS — Z888 Allergy status to other drugs, medicaments and biological substances status: Secondary | ICD-10-CM | POA: Diagnosis not present

## 2020-07-11 DIAGNOSIS — H02401 Unspecified ptosis of right eyelid: Secondary | ICD-10-CM | POA: Diagnosis not present

## 2020-07-11 DIAGNOSIS — M545 Low back pain, unspecified: Secondary | ICD-10-CM | POA: Diagnosis present

## 2020-07-11 DIAGNOSIS — Z79899 Other long term (current) drug therapy: Secondary | ICD-10-CM | POA: Diagnosis not present

## 2020-07-11 DIAGNOSIS — Z794 Long term (current) use of insulin: Secondary | ICD-10-CM | POA: Diagnosis not present

## 2020-07-11 DIAGNOSIS — Z8719 Personal history of other diseases of the digestive system: Secondary | ICD-10-CM | POA: Diagnosis not present

## 2020-07-11 DIAGNOSIS — Z87891 Personal history of nicotine dependence: Secondary | ICD-10-CM | POA: Diagnosis not present

## 2020-07-11 LAB — GLUCOSE, CAPILLARY
Glucose-Capillary: 148 mg/dL — ABNORMAL HIGH (ref 70–99)
Glucose-Capillary: 142 mg/dL — ABNORMAL HIGH (ref 70–99)
Glucose-Capillary: 218 mg/dL — ABNORMAL HIGH (ref 70–99)
Glucose-Capillary: 151 mg/dL — ABNORMAL HIGH (ref 70–99)

## 2020-07-11 MED ORDER — SODIUM CHLORIDE 0.9 % IV SOLN: INTRAVENOUS | Status: AC

## 2020-07-11 NOTE — Progress Notes (Signed)
Jennifer Shelton  GMW:[id number] DOB: 1945-05-30 DOA: 07/09/2020 PCP: Rusty Aus, MD    Brief Narrative:  75 year old who lives independently in a private home with a history of HLD, IBS, PAD, DM 2, HTN, right carotid stenosis status post carotid endarterectomy, depression, GERD, and myasthenia gravis who presented to the ED with 3 to 4 weeks of severe imbalance and gait instability and acute binocular diplopia.  In the ER CT head was without evidence of acute stroke.  Significant Events:  2/24 admit via ER  Date of Positive COVID Test:  07/09/20  Vaccination Status: Unvaccinated   COVID-19 specific Treatment: Remdesivir x3   Antimicrobials:  none   DVT prophylaxis: SCDs  Subjective: No new complaints today. Patient states she is beginning to feel better in general. She is tolerating her IVIG thus far without difficulty. She denies chest pain nausea or vomiting. Her diplopia persists.  Assessment & Plan:  Gait instability - acute binocular diplopiarule - myasthenia gravis exacerbation Neurology following - MRI of the head without evidence of acute CVA - Y40 and folic acid levels not low - TSH normal - medication changes made to avoid agents known to exacerbate MG - IVIG initiated by Neuro for 5 day course   Asymptomatic SARS-CoV-2 infection Incidentally found to be positive at time of admission -no current clinical symptoms suggestive of a significant viral infection - pt meets criteria as "high risk" for severe disease -administering 3 day course of Remdesivir -thus far remains asymptomatic from this standpoint  DM2 CBG well controlled   HLD LDL 40 this admission -continue usual home medical therapy  HTN Blood pressure presently well controlled  PAD   Code Status: FULL CODE Family Communication:  Status is: Inpatient  Remains inpatient appropriate because:Inpatient level of care appropriate due to severity of illness   Dispo: The patient is from: Home               Anticipated d/c is to: unclear              Patient currently is not medically stable to d/c.   Difficult to place patient No   Consultants:  Neuro   Objective: Blood pressure 128/69, pulse 91, temperature 98.3 F (36.8 C), temperature source Oral, resp. rate 14, height 5\' 2"  (1.575 m), weight 79.5 kg, SpO2 95 %.  Intake/Output Summary (Last 24 hours) at 07/11/2020 0941 Last data filed at 07/11/2020 0600 Gross per 24 hour  Intake 916.13 ml  Output --  Net 916.13 ml   Filed Weights   07/09/20 1014 07/09/20 2048  Weight: 77.1 kg 79.5 kg    Examination: General: No acute respiratory distress Lungs: CTA B without wheezing Cardiovascular: RRR without murmur Abdomen: NT/ND, soft, BS positive, no rebound Extremities: No significant clubbing or edema bilateral lower extremities  CBC: Recent Labs  Lab 07/09/20 1019  WBC 7.5  NEUTROABS 4.4  HGB 14.3  HCT 43.8  MCV 82.2  PLT 347   Basic Metabolic Panel: Recent Labs  Lab 07/09/20 1019  NA 137  K 4.0  CL 102  CO2 25  GLUCOSE 138*  BUN 18  CREATININE 0.74  CALCIUM 9.2   GFR: Estimated Creatinine Clearance: 62.1 mL/min (by C-G formula based on SCr of 0.74 mg/dL).  Liver Function Tests: Recent Labs  Lab 07/09/20 1019  AST 27  ALT 28  ALKPHOS 64  BILITOT 1.1  PROT 7.6  ALBUMIN 4.4    Coagulation Profile: Recent Labs  Lab 07/09/20 1019  INR 1.0     HbA1C: Hgb A1c MFr Bld  Date/Time Value Ref Range Status  07/10/2020 05:34 AM 7.5 (H) 4.8 - 5.6 % Final    Comment:    (NOTE) Pre diabetes:          5.7%-6.4%  Diabetes:              >6.4%  Glycemic control for   <7.0% adults with diabetes   07/19/2019 05:16 PM 6.3 (H) 4.8 - 5.6 % Final    Comment:    (NOTE) Pre diabetes:          5.7%-6.4% Diabetes:              >6.4% Glycemic control for   <7.0% adults with diabetes     CBG: Recent Labs  Lab 07/09/20 2252 07/10/20 2200 07/11/20 0804  GLUCAP 166* 132* 148*    Recent Results  (from the past 240 hour(s))  SARS CORONAVIRUS 2 (TAT 6-24 HRS) Nasopharyngeal Nasopharyngeal Swab     Status: Abnormal   Collection Time: 07/09/20  2:30 PM   Specimen: Nasopharyngeal Swab  Result Value Ref Range Status   SARS Coronavirus 2 POSITIVE (A) NEGATIVE Final    Comment: (NOTE) SARS-CoV-2 target nucleic acids are DETECTED.  The SARS-CoV-2 RNA is generally detectable in upper and lower respiratory specimens during the acute phase of infection. Positive results are indicative of the presence of SARS-CoV-2 RNA. Clinical correlation with patient history and other diagnostic information is  necessary to determine patient infection status. Positive results do not rule out bacterial infection or co-infection with other viruses.  The expected result is Negative.  Fact Sheet for Patients: SugarRoll.be  Fact Sheet for Healthcare Providers: https://www.woods-mathews.com/  This test is not yet approved or cleared by the [place] FDA and  has been authorized for detection and/or diagnosis of SARS-CoV-2 by FDA under an Emergency Use Authorization (EUA). This EUA will remain  in effect (meaning this test can be used) for the duration of the COVID-19 declaration under Section 564(b)(1) of the Act, 21 U. S.C. section 360bbb-3(b)(1), unless the authorization is terminated or revoked sooner.   Performed at [facility], [place] [address], [place], [place] [address]      Scheduled Meds: . amLODipine  5 mg Oral QHS  . ascorbic acid  1,000 mg Oral Daily  . aspirin  325 mg Oral Daily  . B-complex with vitamin C  1 tablet Oral Daily  . cholecalciferol  1,000 Units Oral Daily  . FLUoxetine  20 mg Oral TID  . fluticasone  1 spray Each Nare Daily  . folic acid  1 mg Oral Daily  . irbesartan  300 mg Oral Daily   And  . hydrochlorothiazide  12.5 mg Oral Daily  . insulin aspart  0-5 Units Subcutaneous QHS  . insulin aspart  0-9 Units  Subcutaneous TID WC  . insulin glargine  50 Units Subcutaneous QHS  . methylphenidate  10 mg Oral BID  . montelukast  10 mg Oral QHS  . sodium chloride flush  3 mL Intravenous Q12H      LOS: 0 days   Cherene Altes, MD [facility] Office  [phone] Pager - Text Page per Amion  If 7PM-7AM, please contact night-coverage per Amion 07/11/2020, 9:41 AM

## 2020-07-11 NOTE — Plan of Care (Signed)
End of Shift Summary:  A&O x4. VSS. Remained on room air, sats >94%. Denies pain or n/v. Ambulated to bathroom using walker, standby/contact guard assist, steady gait. Urine output adequate. Endorses BLE weakness and double vision that improves with one eye closed. Initiated IVIG (1/5) this shift. Initiated MIVF. Remained free from falls or injury. Airborne/Contact precautions maintained. Call bell within reach and encouraged to use.

## 2020-07-11 NOTE — Progress Notes (Signed)
Subjective: The patient states that she feels less malaise after her first dose of IVIG, but double vision and BLE weakness are still unchanged. Although she previously did not acknowledge leg weakness, she now states that she has been weak in her BLE.   Objective: Current vital signs: BP (!) 145/85 (BP Location: Left Arm)   Pulse 95   Temp 98.2 F (36.8 C)   Resp 16   Ht 5\' 2"  (1.575 m)   Wt 79.5 kg   SpO2 95%   BMI 32.06 kg/m  Vital signs in last 24 hours: Temp:  [97.7 F (36.5 C)-98.5 F (36.9 C)] 98.2 F (36.8 C) (02/26 1618) Pulse Rate:  [71-95] 95 (02/26 1618) Resp:  [14-20] 16 (02/26 1618) BP: (128-157)/(63-85) 145/85 (02/26 1618) SpO2:  [94 %-97 %] 95 % (02/26 1618)  Intake/Output from previous day: 02/25 0701 - 02/26 0700 In: 916.1 [I.V.:916.1] Out: -  Intake/Output this shift: No intake/output data recorded. Nutritional status:  Diet Order            Diet heart healthy/carb modified Room service appropriate? Yes; Fluid consistency: Thin  Diet effective now                 HEENT: [place]/AT Lungs: Respirations unlabored Ext: No edema  Neurological Examination Mental Status:  Alert,fullyoriented, thought content appropriate. Speech fluent without evidence of aphasia. No dysarthria. Able to follow allcommands without difficulty. Cranial Nerves: II: Pupils equal. Fixates and tracks normally.  III,IV, ZO:XWRU bilateral ptosis. There is continued double vision on right lateral and left lateral gaze, as well in central gaze immediately after looking to the left or right. Intermittent esotropia and exotropia with horizontal gaze is unchanged; left eye tends to lag right. There is also continued diplopia today with upgaze. No skew deviation. No nystagmus. V,VII:Smile is symmetric; no "myasthenic snarl" noted. VIII:Hearingintact to voice. IX,X:No hoarseness or hypophonia. EA:[id number] XII:No lingual dysarthria Motor: BUE strength is 5/5. Mild  BLE weakness proximally and distally rated as 4-4+/5 and symmetric, but she can stand with her own power.  Cerebellar:No ataxia noted Gait: Able to stand on her own from chair. Gait is tentative and appears slightly unsteady, but does not require support.   Lab Results: Results for orders placed or performed during the hospital encounter of 07/09/20 (from the past 48 hour(s))  Glucose, capillary     Status: Abnormal   Collection Time: 07/09/20 10:52 PM  Result Value Ref Range   Glucose-Capillary 166 (H) 70 - 99 mg/dL    Comment: Glucose reference range applies only to samples taken after fasting for at least 8 hours.  Hemoglobin A1c     Status: Abnormal   Collection Time: 07/10/20  5:34 AM  Result Value Ref Range   Hgb A1c MFr Bld 7.5 (H) 4.8 - 5.6 %    Comment: (NOTE) Pre diabetes:          5.7%-6.4%  Diabetes:              >6.4%  Glycemic control for   <7.0% adults with diabetes    Mean Plasma Glucose 168.55 mg/dL    Comment: Performed at [facility] [address], [place], [place] [address]  Lipid panel     Status: Abnormal   Collection Time: 07/10/20  5:34 AM  Result Value Ref Range   Cholesterol 115 0 - 200 mg/dL   Triglycerides 239 (H) <150 mg/dL   HDL 27 (L) >40 mg/dL   Total CHOL/HDL Ratio 4.3  RATIO   VLDL 48 (H) 0 - 40 mg/dL   LDL Cholesterol 40 0 - 99 mg/dL    Comment:        Total Cholesterol/HDL:CHD Risk Coronary Heart Disease Risk Table                     Men   Women  1/2 Average Risk   3.4   3.3  Average Risk       5.0   4.4  2 X Average Risk   9.6   7.1  3 X Average Risk  23.4   11.0        Use the calculated Patient Ratio above and the CHD Risk Table to determine the patient's CHD Risk.        ATP III CLASSIFICATION (LDL):  <100     mg/dL   Optimal  100-129  mg/dL   Near or Above                    Optimal  130-159  mg/dL   Borderline  160-189  mg/dL   High  >190     mg/dL   Very High Performed at [facility], [address] [place], [place] [address]   VITAMIN D 25 Hydroxy (Vit-D Deficiency, Fractures)     Status: None   Collection Time: 07/10/20  5:34 AM  Result Value Ref Range   Vit D, 25-Hydroxy 48.19 30 - 100 ng/mL    Comment: (NOTE) Vitamin D deficiency has been defined by the [place] practice guideline as a level of serum 25-OH  vitamin D less than 20 ng/mL (1,2). The Endocrine Society went on to  further define vitamin D insufficiency as a level between 21 and 29  ng/mL (2).  1. IOM (Institute of Medicine). 2010. Dietary reference intakes for  calcium and D. [place]: The [employer]. 2. Holick MF, Binkley [place], Bischoff-Ferrari HA, et al. Evaluation,  treatment, and prevention of vitamin D deficiency: an Endocrine  Society clinical practice guideline, JCEM. 2011 Jul; 96(7): 1911-30.  Performed at [facility], [place] [address] [place], [place] [address]   Vitamin B12     Status: Abnormal   Collection Time: 07/10/20  9:32 AM  Result Value Ref Range   Vitamin B-12 1,113 (H) 180 - 914 pg/mL    Comment: (NOTE) This assay is not validated for testing neonatal or myeloproliferative syndrome specimens for Vitamin B12 levels. Performed at [facility], [place] [address], [place], [place] [address]   Folate     Status: None   Collection Time: 07/10/20  9:32 AM  Result Value Ref Range   Folate 44.0 >5.9 ng/mL    Comment: Performed at [facility], [address] [place], [place] [address]  TSH     Status: None   Collection Time: 07/10/20  9:32 AM  Result Value Ref Range   TSH 1.099 0.350 - 4.500 uIU/mL    Comment: Performed by a 3rd Generation assay with a functional sensitivity of <=0.01 uIU/mL. Performed at [facility], [address] [place], [place] [address]   Glucose, capillary     Status: Abnormal   Collection Time: 07/10/20 10:00 PM  Result Value Ref Range   Glucose-Capillary 132 (H) 70 - 99  mg/dL    Comment: Glucose reference range applies only to samples taken after fasting for at least 8 hours.  Glucose, capillary     Status: Abnormal   Collection Time: 07/11/20  8:04 AM  Result Value Ref Range   Glucose-Capillary 148 (H) 70 - 99 mg/dL    Comment: Glucose reference range applies only to samples taken after fasting for at least 8 hours.  Glucose, capillary     Status: Abnormal   Collection Time: 07/11/20 12:04 PM  Result Value Ref Range   Glucose-Capillary 142 (H) 70 - 99 mg/dL    Comment: Glucose reference range applies only to samples taken after fasting for at least 8 hours.  Glucose, capillary     Status: Abnormal   Collection Time: 07/11/20  4:20 PM  Result Value Ref Range   Glucose-Capillary 218 (H) 70 - 99 mg/dL    Comment: Glucose reference range applies only to samples taken after fasting for at least 8 hours.    Recent Results (from the past 240 hour(s))  SARS CORONAVIRUS 2 (TAT 6-24 HRS) Nasopharyngeal Nasopharyngeal Swab     Status: Abnormal   Collection Time: 07/09/20  2:30 PM   Specimen: Nasopharyngeal Swab  Result Value Ref Range Status   SARS Coronavirus 2 POSITIVE (A) NEGATIVE Final    Comment: (NOTE) SARS-CoV-2 target nucleic acids are DETECTED.  The SARS-CoV-2 RNA is generally detectable in upper and lower respiratory specimens during the acute phase of infection. Positive results are indicative of the presence of SARS-CoV-2 RNA. Clinical correlation with patient history and other diagnostic information is  necessary to determine patient infection status. Positive results do not rule out bacterial infection or co-infection with other viruses.  The expected result is Negative.  Fact Sheet for Patients: SugarRoll.be  Fact Sheet for Healthcare Providers: https://www.woods-mathews.com/  This test is not yet approved or cleared by the [place] FDA and  has been authorized for detection and/or  diagnosis of SARS-CoV-2 by FDA under an Emergency Use Authorization (EUA). This EUA will remain  in effect (meaning this test can be used) for the duration of the COVID-19 declaration under Section 564(b)(1) of the Act, 21 U. S.C. section 360bbb-3(b)(1), unless the authorization is terminated or revoked sooner.   Performed at [facility], [place] [address] [place],  [address]     Lipid Panel Recent Labs    07/10/20 0534  CHOL 115  TRIG 239*  HDL 27*  CHOLHDL 4.3  VLDL 48*  LDLCALC 40    Studies/Results: MR BRAIN W CONTRAST  Result Date: 07/09/2020 CLINICAL DATA:  Diplopia. EXAM: MRI HEAD WITH CONTRAST TECHNIQUE: Multiplanar, multiecho pulse sequences of the brain and surrounding structures were obtained with intravenous contrast. CONTRAST:  2mL GADAVIST GADOBUTROL 1 MMOL/ML IV SOLN COMPARISON:  Noncontrast study earlier same day. FINDINGS: Brain: Repeat diffusion imaging does not show any acute or subacute infarction. Postcontrast imaging is normal. No abnormal enhancement of the brain or leptomeninges. No visible orbital pathology as seen. Vascular: Major vessels at the base of the brain show flow. Skull and upper cervical spine: Negative Sinuses/Orbits: Clear/normal Other: None IMPRESSION: Normal postcontrast imaging. No abnormality seen to explain the presenting symptoms. Electronically Signed   By: Nelson Chimes M.D.   On: 07/09/2020 22:14    Medications:  Scheduled: . amLODipine  5 mg Oral QHS  . ascorbic acid  1,000 mg Oral Daily  . aspirin  325 mg Oral Daily  . B-complex with vitamin C  1 tablet Oral Daily  . cholecalciferol  1,000 Units Oral Daily  . FLUoxetine  20 mg Oral TID  .  fluticasone  1 spray Each Nare Daily  . folic acid  1 mg Oral Daily  . irbesartan  300 mg Oral Daily   And  . hydrochlorothiazide  12.5 mg Oral Daily  . insulin aspart  0-5 Units Subcutaneous QHS  . insulin aspart  0-9 Units Subcutaneous TID WC  . insulin glargine  50 Units  Subcutaneous QHS  . methylphenidate  10 mg Oral BID  . montelukast  10 mg Oral QHS  . sodium chloride flush  3 mL Intravenous Q12H   Continuous: . sodium chloride 75 mL/hr at 07/11/20 1253  . Immune Globulin 10% Stopped (07/11/20 0112)  . remdesivir 100 mg in NS 100 mL Stopped (07/11/20 1015)    Assessment:75 year old female with a known history of myasthenia gravis (diagnosed 1 year ago by antibodies), not on Mestinon or immunomodulatory agent, presenting with a 3 week history of stumbling gait and subjective unsteadiness followed by acute onset of binocular diplopia.She has PVD with significant atherosclerotic changes seen on CTA of head and neck from last year. She is s/p right ICA stenting last year. Although not listed in Epic, she also endorses a procedure consistent with possible prior bilateral iliac stenting for BLE intermittent claudication symptoms. Stroke has been ruled out with MRI. She has completed day 1/5 of IVIG without improvement so far.  1.Exam today is unchanged from yesterday. 2. MRI brain:No evidence of acute intracranial abnormality. 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 There are a few scattered chronicmicrohemorrhages within the right frontal lobe, which are new from the prior brain MRI. Post contrast images are negative.  3. She has tested positive for Covid but is asymptomatic.She is currently on remdesivir.  Recommendations: 1.See initial consult note for a detailed list of medications to avoid in MG. Several of her home meds are known to result in worsened muscle strength in MG and have been held during this admission.  2. A five day course of IVIG has been ordered. Second dose is tonight. To reduce the risk of blood clots, IV NS for 24 hours has been ordered. Reassess and continue IVF each day if no evidence for volume overload. Monitor potassium levels.  3. Daily NIF  and FVC with RT. Please document in Progress Notes.  4.Neurology will continue to follow with you.   LOS: 0 days   @Electronically  signed: Dr. Kerney Elbe 07/11/2020  7:00 PM

## 2020-07-12 LAB — COMPREHENSIVE METABOLIC PANEL
ALT: 22 U/L (ref 0–44)
AST: 20 U/L (ref 15–41)
Albumin: 3.6 g/dL (ref 3.5–5.0)
Alkaline Phosphatase: 47 U/L (ref 38–126)
Anion gap: 7 (ref 5–15)
BUN: 18 mg/dL (ref 8–23)
CO2: 26 mmol/L (ref 22–32)
Calcium: 9.3 mg/dL (ref 8.9–10.3)
Chloride: 104 mmol/L (ref 98–111)
Creatinine, Ser: 0.61 mg/dL (ref 0.44–1.00)
GFR, Estimated: >60 mL/min (ref >60)
Glucose, Bld: 141 mg/dL — ABNORMAL HIGH (ref 70–99)
Potassium: 3.6 mmol/L (ref 3.5–5.1)
Sodium: 137 mmol/L (ref 135–145)
Total Bilirubin: 0.5 mg/dL (ref 0.3–1.2)
Total Protein: 7.5 g/dL (ref 6.5–8.1)

## 2020-07-12 LAB — GLUCOSE, CAPILLARY
Glucose-Capillary: 163 mg/dL — ABNORMAL HIGH (ref 70–99)
Glucose-Capillary: 170 mg/dL — ABNORMAL HIGH (ref 70–99)
Glucose-Capillary: 146 mg/dL — ABNORMAL HIGH (ref 70–99)
Glucose-Capillary: 137 mg/dL — ABNORMAL HIGH (ref 70–99)

## 2020-07-12 LAB — CBC
HCT: 40.6 % (ref 36.0–46.0)
Hemoglobin: 14 g/dL (ref 12.0–15.0)
MCH: 27.9 pg (ref 26.0–34.0)
MCHC: 34.5 g/dL (ref 30.0–36.0)
MCV: 80.9 fL (ref 80.0–100.0)
Platelets: 173 10*3/uL (ref 150–400)
RBC: 5.02 MIL/uL (ref 3.87–5.11)
RDW: 13.5 % (ref 11.5–15.5)
WBC: 5.8 10*3/uL (ref 4.0–10.5)
nRBC: 0 % (ref 0.0–0.2)

## 2020-07-12 MED ORDER — SODIUM CHLORIDE 0.9 % IV SOLN: INTRAVENOUS | Status: DC

## 2020-07-12 MED ORDER — ENOXAPARIN SODIUM 40 MG/0.4ML ~~LOC~~ SOLN
40.0000 mg | SUBCUTANEOUS | Status: DC
  Administered 2020-07-12 – 2020-07-15 (×4): 40 mg via SUBCUTANEOUS
  Filled 2020-07-12 (×4): qty 0.4

## 2020-07-12 NOTE — Progress Notes (Signed)
Jennifer Shelton  AVW:[id number] DOB: 1946-03-28 DOA: 07/09/2020 PCP: Rusty Aus, MD    Brief Narrative:  75 year old who lives independently in a private home with a history of HLD, IBS, PAD, DM 2, HTN, right carotid stenosis status post carotid endarterectomy, depression, GERD, and myasthenia gravis who presented to the ED with 3 to 4 weeks of severe imbalance and gait instability and acute binocular diplopia.  In the ER CT head was without evidence of acute stroke.  Significant Events:  2/24 admit via ER  Date of Positive COVID Test:  07/09/20  Date Isolation Ends: 07/20/20 (inpatient - potentially shorter if d/c home)  Vaccination Status: Unvaccinated   COVID-19 specific Treatment: Remdesivir x3   Antimicrobials:  none   DVT prophylaxis: lovenox   Subjective: Afebrile.  Vital signs stable.  Saturations 96% on room air. No new complaints today. Feels "about the same as yesterday."  Assessment & Plan:  Gait instability - acute binocular diplopiarule - myasthenia gravis exacerbation Neurology following - MRI of the head without evidence of acute CVA - W29 and folic acid levels not low - TSH normal - medication changes made to avoid agents known to exacerbate MG - IVIG initiated by Neuro for 5 day course   Asymptomatic SARS-CoV-2 infection Incidentally found to be positive at time of admission - no current clinical symptoms suggestive of a significant viral infection - pt meets criteria as "high risk" for severe disease - administering 3 day course of Remdesivir - thus far remains asymptomatic from this standpoint  DM2 CBG well controlled   HLD LDL 40 this admission - continue usual home medical therapy  HTN Blood pressure presently well controlled  PAD   Code Status: FULL CODE Family Communication:  Status is: Inpatient  Remains inpatient appropriate because:Inpatient level of care appropriate due to severity of illness   Dispo: The patient is from: Home               Anticipated d/c is to: unclear              Patient currently is not medically stable to d/c.   Difficult to place patient No   Consultants:  Neuro   Objective: Blood pressure 137/67, pulse 87, temperature 98.1 F (36.7 C), temperature source Oral, resp. rate 16, height 5\' 2"  (1.575 m), weight 79.5 kg, SpO2 96 %.  Intake/Output Summary (Last 24 hours) at 07/12/2020 0909 Last data filed at 07/12/2020 0500 Gross per 24 hour  Intake 1860 ml  Output --  Net 1860 ml   Filed Weights   07/09/20 1014 07/09/20 2048  Weight: 77.1 kg 79.5 kg    Examination: General: No acute respiratory distress Lungs: CTA B  Cardiovascular: RRR  Abdomen: NT/ND, soft, BS positive, no rebound Extremities: No clubbing or edema bilateral lower extremities  CBC: Recent Labs  Lab 07/09/20 1019 07/12/20 0544  WBC 7.5 5.8  NEUTROABS 4.4  --   HGB 14.3 14.0  HCT 43.8 40.6  MCV 82.2 80.9  PLT 185 562   Basic Metabolic Panel: Recent Labs  Lab 07/09/20 1019 07/12/20 0544  NA 137 137  K 4.0 3.6  CL 102 104  CO2 25 26  GLUCOSE 138* 141*  BUN 18 18  CREATININE 0.74 0.61  CALCIUM 9.2 9.3   GFR: Estimated Creatinine Clearance: 62.1 mL/min (by C-G formula based on SCr of 0.61 mg/dL).  Liver Function Tests: Recent Labs  Lab 07/09/20 1019 07/12/20 0544  AST 27 20  ALT  28 22  ALKPHOS 64 47  BILITOT 1.1 0.5  PROT 7.6 7.5  ALBUMIN 4.4 3.6    Coagulation Profile: Recent Labs  Lab 07/09/20 1019  INR 1.0     HbA1C: Hgb A1c MFr Bld  Date/Time Value Ref Range Status  07/10/2020 05:34 AM 7.5 (H) 4.8 - 5.6 % Final    Comment:    (NOTE) Pre diabetes:          5.7%-6.4%  Diabetes:              >6.4%  Glycemic control for   <7.0% adults with diabetes   07/19/2019 05:16 PM 6.3 (H) 4.8 - 5.6 % Final    Comment:    (NOTE) Pre diabetes:          5.7%-6.4% Diabetes:              >6.4% Glycemic control for   <7.0% adults with diabetes     CBG: Recent Labs  Lab  07/11/20 0804 07/11/20 1204 07/11/20 1620 07/11/20 2110 07/12/20 0823  GLUCAP 148* 142* 218* 151* 163*    Recent Results (from the past 240 hour(s))  SARS CORONAVIRUS 2 (TAT 6-24 HRS) Nasopharyngeal Nasopharyngeal Swab     Status: Abnormal   Collection Time: 07/09/20  2:30 PM   Specimen: Nasopharyngeal Swab  Result Value Ref Range Status   SARS Coronavirus 2 POSITIVE (A) NEGATIVE Final    Comment: (NOTE) SARS-CoV-2 target nucleic acids are DETECTED.  The SARS-CoV-2 RNA is generally detectable in upper and lower respiratory specimens during the acute phase of infection. Positive results are indicative of the presence of SARS-CoV-2 RNA. Clinical correlation with patient history and other diagnostic information is  necessary to determine patient infection status. Positive results do not rule out bacterial infection or co-infection with other viruses.  The expected result is Negative.  Fact Sheet for Patients: SugarRoll.be  Fact Sheet for Healthcare Providers: https://www.woods-mathews.com/  This test is not yet approved or cleared by the [place] FDA and  has been authorized for detection and/or diagnosis of SARS-CoV-2 by FDA under an Emergency Use Authorization (EUA). This EUA will remain  in effect (meaning this test can be used) for the duration of the COVID-19 declaration under Section 564(b)(1) of the Act, 21 U. S.C. section 360bbb-3(b)(1), unless the authorization is terminated or revoked sooner.   Performed at [facility], [place] [address] [place], [place] [address]      Scheduled Meds:  amLODipine  5 mg Oral QHS   ascorbic acid  1,000 mg Oral Daily   aspirin  325 mg Oral Daily   B-complex with vitamin C  1 tablet Oral Daily   cholecalciferol  1,000 Units Oral Daily   FLUoxetine  20 mg Oral TID   fluticasone  1 spray Each Nare Daily   folic acid  1 mg Oral Daily   irbesartan  300 mg Oral Daily    And   hydrochlorothiazide  12.5 mg Oral Daily   insulin aspart  0-5 Units Subcutaneous QHS   insulin aspart  0-9 Units Subcutaneous TID WC   insulin glargine  50 Units Subcutaneous QHS   methylphenidate  10 mg Oral BID   montelukast  10 mg Oral QHS   sodium chloride flush  3 mL Intravenous Q12H      LOS: 1 day   Cherene Altes, MD [facility] Office  [phone] Pager - Text Page per Amion  If 7PM-7AM, please contact night-coverage per [facility]  07/12/2020, 9:09 AM

## 2020-07-13 LAB — GLUCOSE, CAPILLARY
Glucose-Capillary: 132 mg/dL — ABNORMAL HIGH (ref 70–99)
Glucose-Capillary: 172 mg/dL — ABNORMAL HIGH (ref 70–99)
Glucose-Capillary: 240 mg/dL — ABNORMAL HIGH (ref 70–99)
Glucose-Capillary: 127 mg/dL — ABNORMAL HIGH (ref 70–99)

## 2020-07-13 NOTE — Progress Notes (Signed)
Physical Therapy Treatment Patient Details Name: Jennifer Shelton MRN: [id number] DOB: Mar 19, 1946 Today's Date: 07/13/2020    History of Present Illness Jennifer Shelton is a 75 y.o. female with medical history significant for hyperlipidemia, IBS, PAD, insulin-dependent diabetes mellitus, hypertension, right carotid stenosis status post in 08/05/2019, depression, GERD, PAD, myasthenia gravis diagnosed about 1 year ago, presented to the emergency department for chief concerns of imbalance for 3 to 4 weeks.  She reports that whenever she ambulates she feels unsteady on her foot and wants to walk in a straight line or towards the right. She denies numbness tingling of her bilateral upper extremity or facial droop and denies numbness and tingling of her face. She endorses ringing in Jennifer Shelton..  She reports all of this started approximately end of January 2022. She denies fever, chills, shortness of breath, chest pain, abdominal pain, diarrhea, dysuria, hematuria.  She denies changes to her diet.  She denies head trauma the last 6 months.    PT Comments    Pt ready for session.  Stated she has been up walking in room with no AD and reported being more comfortable with mobility.  She is able to stand and walk with no AD x 2 laps in room.  Some unsteadiness noted and stated she did not notice imbalances earlier "I must be getting tired".  Jennifer Shelton is given and gait is improved and she agrees.  Continues with 2 more laps from door to windows and back.   Discussed mobility.  Pt encouraged to use walker in room if she chooses to continue to walk on her own in facility.  Encouraged to use walker at home especially outside and in community.  Stated she has a small 1 bedroom apartment with things close to hold onto.  Voiced understanding.  She did state she initially declined therapy once discharged but has reconsidered.  Will relay to TOC.   Follow Up Recommendations  Outpatient PT     Equipment  Recommendations  Rolling walker with 5" wheels    Recommendations for Other Services       Precautions / Restrictions Precautions Precautions: Fall Precaution Comments: mod fall risk; airborne and contact precautions Restrictions Weight Bearing Restrictions: No    Mobility  Bed Mobility Overal bed mobility: Independent                  Transfers Overall transfer level: Independent                  Ambulation/Gait Ambulation/Gait assistance: Min guard;Supervision Gait Distance (Feet): 100 Feet Assistive device: Rolling walker (2 wheeled);None Gait Pattern/deviations: Step-through pattern;Decreased step length - right;Decreased step length - left;Staggering left;Staggering right Gait velocity: decr   General Gait Details: with and without AD.  Improved with RW   Stairs             Wheelchair Mobility    Modified Rankin (Stroke Patients Only)       Balance Overall balance assessment: Needs assistance Sitting-balance support: Feet supported Sitting balance-Leahy Scale: Normal     Standing balance support: No upper extremity supported Standing balance-Leahy Scale: Fair                              Cognition Arousal/Alertness: Awake/alert Behavior During Therapy: WFL for tasks assessed/performed Overall Cognitive Status: Within Functional Limits for tasks assessed  Exercises      General Comments        Pertinent Vitals/Pain Pain Assessment: No/denies pain    Home Living                      Prior Function            PT Goals (current goals can now be found in the care plan section) Progress towards PT goals: Progressing toward goals    Frequency           PT Plan      Co-evaluation              AM-PAC PT "6 Clicks" Mobility   Outcome Measure  Help needed turning from your back to your side while in a flat bed without using  bedrails?: None Help needed moving from lying on your back to sitting on the side of a flat bed without using bedrails?: None Help needed moving to and from a bed to a chair (including a wheelchair)?: None Help needed standing up from a chair using your arms (e.g., wheelchair or bedside chair)?: None Help needed to walk in hospital room?: A Little Help needed climbing 3-5 steps with a railing? : A Little 6 Click Score: 22    End of Session   Activity Tolerance: Patient tolerated treatment well Patient left: in bed;with call bell/phone within reach Nurse Communication: Mobility status PT Visit Diagnosis: Unsteadiness on feet (R26.81);Difficulty in walking, not elsewhere classified (R26.2)     Time: 9191-6606 PT Time Calculation (min) (ACUTE ONLY): 13 min  Charges:  $Gait Training: 8-22 mins                    Chesley Noon, PTA 07/13/20, 3:36 PM

## 2020-07-13 NOTE — Progress Notes (Signed)
Per pt request, phone call to update family not performed.

## 2020-07-13 NOTE — Progress Notes (Signed)
NIF: -40 FVC: 1.70

## 2020-07-13 NOTE — Progress Notes (Signed)
Jennifer Shelton  EHM:[id number] DOB: 02/12/1946 DOA: 07/09/2020 PCP: Rusty Aus, MD    Brief Narrative:  75 year old who lives independently in a private home with a history of HLD, IBS, PAD, DM 2, HTN, right carotid stenosis status post carotid endarterectomy, depression, GERD, and myasthenia gravis who presented to the ED with 3 to 4 weeks of severe imbalance and gait instability and acute binocular diplopia.  In the ER CT head was without evidence of acute stroke.  Significant Events:  2/24 admit via ER  Date of Positive COVID Test:  07/09/20  Date Isolation Ends: 07/20/20 (inpatient - potentially shorter if d/c home)  Vaccination Status: Unvaccinated   COVID-19 specific Treatment: Remdesivir x3   Antimicrobials:  none   DVT prophylaxis: lovenox   Subjective: Afebrile.  Saturation 96% on room air.  IVIG infusion continues without significant side effects thus far.  Feels that she is noticing an appreciable improvement in her strength and early improvement in her diplopia today.  Denies shortness of breath.  No cough fevers or chills.  Assessment & Plan:  Gait instability - acute binocular diplopiarule - myasthenia gravis exacerbation Neurology following - MRI of the head without evidence of acute CVA - Z66 and folic acid levels not low - TSH normal - medication changes made to avoid agents known to exacerbate MG - IVIG initiated by Neuro for 5 day course - anticipate d/c home ~ Wed   Asymptomatic SARS-CoV-2 infection Incidentally found to be positive at time of admission - no current clinical symptoms suggestive of a significant viral infection - pt meets criteria as "high risk" for severe disease - administered 3 day course of Remdesivir - remains asymptomatic from this standpoint  DM2 CBG well controlled   HLD LDL 40 this admission - continue usual home medical therapy  HTN Blood pressure presently well controlled  PAD   Code Status: FULL CODE Family  Communication:  Status is: Inpatient  Remains inpatient appropriate because:Inpatient level of care appropriate due to severity of illness   Dispo: The patient is from: Home              Anticipated d/c is to: unclear              Patient currently is not medically stable to d/c.   Difficult to place patient No   Consultants:  Neuro   Objective: Blood pressure (!) 158/66, pulse 87, temperature (!) 97.3 F (36.3 C), temperature source Oral, resp. rate 16, height 5\' 2"  (1.575 m), weight 79.5 kg, SpO2 96 %.  Intake/Output Summary (Last 24 hours) at 07/13/2020 0855 Last data filed at 07/13/2020 0700 Gross per 24 hour  Intake 1382.21 ml  Output 700 ml  Net 682.21 ml   Filed Weights   07/09/20 1014 07/09/20 2048  Weight: 77.1 kg 79.5 kg    Examination: General: No acute respiratory distress Lungs: CTA B  Cardiovascular: RRR w/o M  Abdomen: NT/ND, soft, BS positive, no rebound Extremities: No clubbing or edema B LE   CBC: Recent Labs  Lab 07/09/20 1019 07/12/20 0544  WBC 7.5 5.8  NEUTROABS 4.4  --   HGB 14.3 14.0  HCT 43.8 40.6  MCV 82.2 80.9  PLT 185 294   Basic Metabolic Panel: Recent Labs  Lab 07/09/20 1019 07/12/20 0544  NA 137 137  K 4.0 3.6  CL 102 104  CO2 25 26  GLUCOSE 138* 141*  BUN 18 18  CREATININE 0.74 0.61  CALCIUM 9.2 9.3  GFR: Estimated Creatinine Clearance: 62.1 mL/min (by C-G formula based on SCr of 0.61 mg/dL).  Liver Function Tests: Recent Labs  Lab 07/09/20 1019 07/12/20 0544  AST 27 20  ALT 28 22  ALKPHOS 64 47  BILITOT 1.1 0.5  PROT 7.6 7.5  ALBUMIN 4.4 3.6    Coagulation Profile: Recent Labs  Lab 07/09/20 1019  INR 1.0     HbA1C: Hgb A1c MFr Bld  Date/Time Value Ref Range Status  07/10/2020 05:34 AM 7.5 (H) 4.8 - 5.6 % Final    Comment:    (NOTE) Pre diabetes:          5.7%-6.4%  Diabetes:              >6.4%  Glycemic control for   <7.0% adults with diabetes   07/19/2019 05:16 PM 6.3 (H) 4.8 - 5.6 %  Final    Comment:    (NOTE) Pre diabetes:          5.7%-6.4% Diabetes:              >6.4% Glycemic control for   <7.0% adults with diabetes     CBG: Recent Labs  Lab 07/12/20 0823 07/12/20 1126 07/12/20 1602 07/12/20 1942 07/13/20 0829  GLUCAP 163* 170* 146* 137* 132*    Recent Results (from the past 240 hour(s))  SARS CORONAVIRUS 2 (TAT 6-24 HRS) Nasopharyngeal Nasopharyngeal Swab     Status: Abnormal   Collection Time: 07/09/20  2:30 PM   Specimen: Nasopharyngeal Swab  Result Value Ref Range Status   SARS Coronavirus 2 POSITIVE (A) NEGATIVE Final    Comment: (NOTE) SARS-CoV-2 target nucleic acids are DETECTED.  The SARS-CoV-2 RNA is generally detectable in upper and lower respiratory specimens during the acute phase of infection. Positive results are indicative of the presence of SARS-CoV-2 RNA. Clinical correlation with patient history and other diagnostic information is  necessary to determine patient infection status. Positive results do not rule out bacterial infection or co-infection with other viruses.  The expected result is Negative.  Fact Sheet for Patients: SugarRoll.be  Fact Sheet for Healthcare Providers: https://www.woods-mathews.com/  This test is not yet approved or cleared by the [place] FDA and  has been authorized for detection and/or diagnosis of SARS-CoV-2 by FDA under an Emergency Use Authorization (EUA). This EUA will remain  in effect (meaning this test can be used) for the duration of the COVID-19 declaration under Section 564(b)(1) of the Act, 21 U. S.C. section 360bbb-3(b)(1), unless the authorization is terminated or revoked sooner.   Performed at [facility], [place] [address], [place], [place] [address]      Scheduled Meds: . amLODipine  5 mg Oral QHS  . ascorbic acid  1,000 mg Oral Daily  . aspirin  325 mg Oral Daily  . B-complex with vitamin C  1 tablet Oral Daily  .  cholecalciferol  1,000 Units Oral Daily  . enoxaparin (LOVENOX) injection  40 mg Subcutaneous Q24H  . FLUoxetine  20 mg Oral TID  . fluticasone  1 spray Each Nare Daily  . folic acid  1 mg Oral Daily  . irbesartan  300 mg Oral Daily   And  . hydrochlorothiazide  12.5 mg Oral Daily  . insulin aspart  0-5 Units Subcutaneous QHS  . insulin aspart  0-9 Units Subcutaneous TID WC  . insulin glargine  50 Units Subcutaneous QHS  . methylphenidate  10 mg Oral BID  . montelukast  10 mg Oral QHS  .  sodium chloride flush  3 mL Intravenous Q12H      LOS: 2 days   Cherene Altes, MD [facility] Office  [phone] Pager - Text Page per Amion  If 7PM-7AM, please contact night-coverage per Amion 07/13/2020, 8:55 AM

## 2020-07-13 NOTE — Progress Notes (Signed)
NIF:-40 FVC:1.45

## 2020-07-14 LAB — CBC
HCT: 39.7 % (ref 36.0–46.0)
Hemoglobin: 13.4 g/dL (ref 12.0–15.0)
MCH: 27.4 pg (ref 26.0–34.0)
MCHC: 33.8 g/dL (ref 30.0–36.0)
MCV: 81.2 fL (ref 80.0–100.0)
Platelets: 172 10*3/uL (ref 150–400)
RBC: 4.89 MIL/uL (ref 3.87–5.11)
RDW: 13.5 % (ref 11.5–15.5)
WBC: 5.4 10*3/uL (ref 4.0–10.5)
nRBC: 0 % (ref 0.0–0.2)

## 2020-07-14 LAB — BASIC METABOLIC PANEL
Anion gap: 4 — ABNORMAL LOW (ref 5–15)
BUN: 18 mg/dL (ref 8–23)
CO2: 29 mmol/L (ref 22–32)
Calcium: 9 mg/dL (ref 8.9–10.3)
Chloride: 102 mmol/L (ref 98–111)
Creatinine, Ser: 0.7 mg/dL (ref 0.44–1.00)
GFR, Estimated: >60 mL/min (ref >60)
Glucose, Bld: 161 mg/dL — ABNORMAL HIGH (ref 70–99)
Potassium: 3.8 mmol/L (ref 3.5–5.1)
Sodium: 135 mmol/L (ref 135–145)

## 2020-07-14 LAB — GLUCOSE, CAPILLARY
Glucose-Capillary: 114 mg/dL — ABNORMAL HIGH (ref 70–99)
Glucose-Capillary: 184 mg/dL — ABNORMAL HIGH (ref 70–99)
Glucose-Capillary: 141 mg/dL — ABNORMAL HIGH (ref 70–99)
Glucose-Capillary: 137 mg/dL — ABNORMAL HIGH (ref 70–99)

## 2020-07-14 LAB — MAGNESIUM: Magnesium: 1.3 mg/dL — ABNORMAL LOW (ref 1.7–2.4)

## 2020-07-14 MED ORDER — HYDROCHLOROTHIAZIDE 25 MG PO TABS
25.0000 mg | ORAL_TABLET | Freq: Every day | ORAL | Status: DC
  Administered 2020-07-15: 25 mg via ORAL
  Filled 2020-07-14: qty 1

## 2020-07-14 MED ORDER — POLYVINYL ALCOHOL 1.4 % OP SOLN
1.0000 [drp] | OPHTHALMIC | Status: DC | PRN
  Administered 2020-07-14: 1 [drp] via OPHTHALMIC
  Filled 2020-07-14: qty 15

## 2020-07-14 MED ORDER — MAGNESIUM SULFATE 4 GM/100ML IV SOLN
4.0000 g | Freq: Once | INTRAVENOUS | Status: AC
  Administered 2020-07-14: 4 g via INTRAVENOUS
  Filled 2020-07-14: qty 100

## 2020-07-14 MED ORDER — IRBESARTAN 150 MG PO TABS
300.0000 mg | ORAL_TABLET | Freq: Every day | ORAL | Status: DC
  Administered 2020-07-15: 08:00:00 300 mg via ORAL
  Filled 2020-07-14: qty 2

## 2020-07-14 MED ORDER — ARTIFICIAL TEARS OPHTHALMIC OINT
TOPICAL_OINTMENT | OPHTHALMIC | Status: DC | PRN
  Filled 2020-07-14: qty 3.5

## 2020-07-14 NOTE — Progress Notes (Signed)
Shift Summary: Pt AOx4, remains on RA, VSS. C/o pain addressed with PRN tylenol per MAR. Independent with ambulation to BR. IVIG administered overnight per [facility] w/o issue. Fall/safety precautions in place, rounding performed, needs/concerns addressed during shift.

## 2020-07-14 NOTE — Progress Notes (Signed)
Jennifer Shelton  YHC:[id number] DOB: [birth year] DOA: 07/09/2020 PCP: Rusty Aus, MD    Brief Narrative:  75 year old who lives independently in a private home with a history of HLD, IBS, PAD, DM 2, HTN, right carotid stenosis status post carotid endarterectomy, depression, GERD, and myasthenia gravis who presented to the ED with 3 to 4 weeks of severe imbalance and gait instability and acute binocular diplopia.  In the ER CT head was without evidence of acute stroke.  Significant Events:  2/24 admit via ER  Date of Positive COVID Test:  07/09/20  Date Isolation Ends: 07/20/20 (inpatient - potentially shorter after d/c home)  Vaccination Status: Unvaccinated   COVID-19 specific Treatment: Remdesivir x3   Antimicrobials:  none   DVT prophylaxis: lovenox   Subjective: Afebrile.  Blood pressure modestly elevated.  Saturations 95% on room air.  Tolerating her ongoing IVIG infusions well thus far.  Due for her last dose of IVIG tonight.  Reports continued gradual improvement in her weakness and diplopia.  No new complaints.  Denies chest pain or shortness of breath.  Assessment & Plan:  Gait instability - acute binocular diplopiarule - myasthenia gravis exacerbation Neurology following - MRI of the head without evidence of acute CVA - D17 and folic acid levels not low - TSH normal - medication changes made to avoid agents known to exacerbate MG (at suggestion of Neuro) - IVIG initiated by Neuro for 5 day course - anticipate d/c home ~ Wed   Severe hypomagnesemia Replace and follow trend  Asymptomatic SARS-CoV-2 infection Incidentally found to be positive at time of admission - no current clinical symptoms suggestive of a significant viral infection - pt meets criteria as "high risk" for severe disease - administered 3 day course of Remdesivir - remains asymptomatic from this standpoint  DM2 CBG well controlled   HLD LDL 40 this admission - continue usual home medical  therapy  HTN Blood pressure trending upward w/ d/c of meds potentially contributing to her MG exacerbation - titrate meds and follow   PAD   Code Status: FULL CODE Family Communication:  Status is: Inpatient  Remains inpatient appropriate because:Inpatient level of care appropriate due to severity of illness   Dispo: The patient is from: Home              Anticipated d/c is to: unclear              Patient currently is not medically stable to d/c.   Difficult to place patient No   Consultants:  Neuro   Objective: Blood pressure (!) 154/68, pulse 88, temperature 98.4 F (36.9 C), temperature source Oral, resp. rate 14, height 5\' 2"  (1.575 m), weight 79.5 kg, SpO2 95 %.  Intake/Output Summary (Last 24 hours) at 07/14/2020 0929 Last data filed at 07/14/2020 0327 Gross per 24 hour  Intake 550 ml  Output --  Net 550 ml   Filed Weights   07/09/20 1014 07/09/20 2048  Weight: 77.1 kg 79.5 kg    Examination: General: No acute respiratory distress Lungs: CTA B - now wheezing  Cardiovascular: RRR w/o M  Abdomen: NT/ND, soft, BS positive Extremities: No clubbing or edema B lower extrem   CBC: Recent Labs  Lab 07/09/20 1019 07/12/20 0544 07/14/20 0516  WBC 7.5 5.8 5.4  NEUTROABS 4.4  --   --   HGB 14.3 14.0 13.4  HCT 43.8 40.6 39.7  MCV 82.2 80.9 81.2  PLT 185 173 616   Basic Metabolic  Panel: Recent Labs  Lab 07/09/20 1019 07/12/20 0544 07/14/20 0516  NA 137 137 135  K 4.0 3.6 3.8  CL 102 104 102  CO2 25 26 29   GLUCOSE 138* 141* 161*  BUN 18 18 18   CREATININE 0.74 0.61 0.70  CALCIUM 9.2 9.3 9.0  MG  --   --  1.3*   GFR: Estimated Creatinine Clearance: 62.1 mL/min (by C-G formula based on SCr of 0.7 mg/dL).  Liver Function Tests: Recent Labs  Lab 07/09/20 1019 07/12/20 0544  AST 27 20  ALT 28 22  ALKPHOS 64 47  BILITOT 1.1 0.5  PROT 7.6 7.5  ALBUMIN 4.4 3.6    Coagulation Profile: Recent Labs  Lab 07/09/20 1019  INR 1.0     HbA1C: Hgb  A1c MFr Bld  Date/Time Value Ref Range Status  07/10/2020 05:34 AM 7.5 (H) 4.8 - 5.6 % Final    Comment:    (NOTE) Pre diabetes:          5.7%-6.4%  Diabetes:              >6.4%  Glycemic control for   <7.0% adults with diabetes   07/19/2019 05:16 PM 6.3 (H) 4.8 - 5.6 % Final    Comment:    (NOTE) Pre diabetes:          5.7%-6.4% Diabetes:              >6.4% Glycemic control for   <7.0% adults with diabetes     CBG: Recent Labs  Lab 07/13/20 0829 07/13/20 1248 07/13/20 1552 07/13/20 2122 07/14/20 0821  GLUCAP 132* 172* 240* 127* 114*    Recent Results (from the past 240 hour(s))  SARS CORONAVIRUS 2 (TAT 6-24 HRS) Nasopharyngeal Nasopharyngeal Swab     Status: Abnormal   Collection Time: 07/09/20  2:30 PM   Specimen: Nasopharyngeal Swab  Result Value Ref Range Status   SARS Coronavirus 2 POSITIVE (A) NEGATIVE Final    Comment: (NOTE) SARS-CoV-2 target nucleic acids are DETECTED.  The SARS-CoV-2 RNA is generally detectable in upper and lower respiratory specimens during the acute phase of infection. Positive results are indicative of the presence of SARS-CoV-2 RNA. Clinical correlation with patient history and other diagnostic information is  necessary to determine patient infection status. Positive results do not rule out bacterial infection or co-infection with other viruses.  The expected result is Negative.  Fact Sheet for Patients: SugarRoll.be  Fact Sheet for Healthcare Providers: https://www.woods-mathews.com/  This test is not yet approved or cleared by the [place] FDA and  has been authorized for detection and/or diagnosis of SARS-CoV-2 by FDA under an Emergency Use Authorization (EUA). This EUA will remain  in effect (meaning this test can be used) for the duration of the COVID-19 declaration under Section 564(b)(1) of the Act, 21 U. S.C. section 360bbb-3(b)(1), unless the authorization is terminated  or revoked sooner.   Performed at [facility], [place] [address] [place], [place] [address]      Scheduled Meds: . ascorbic acid  1,000 mg Oral Daily  . aspirin  325 mg Oral Daily  . B-complex with vitamin C  1 tablet Oral Daily  . cholecalciferol  1,000 Units Oral Daily  . enoxaparin (LOVENOX) injection  40 mg Subcutaneous Q24H  . FLUoxetine  20 mg Oral TID  . fluticasone  1 spray Each Nare Daily  . folic acid  1 mg Oral Daily  . irbesartan  300 mg Oral Daily  And  . hydrochlorothiazide  12.5 mg Oral Daily  . insulin aspart  0-5 Units Subcutaneous QHS  . insulin aspart  0-9 Units Subcutaneous TID WC  . insulin glargine  50 Units Subcutaneous QHS  . methylphenidate  10 mg Oral BID  . montelukast  10 mg Oral QHS  . sodium chloride flush  3 mL Intravenous Q12H      LOS: 3 days   Cherene Altes, MD [facility] Office  [phone] Pager - Text Page per Amion  If 7PM-7AM, please contact night-coverage per Amion 07/14/2020, 9:29 AM

## 2020-07-14 NOTE — Progress Notes (Signed)
NIF: -40 FVC: 1.91

## 2020-07-14 NOTE — Progress Notes (Signed)
NIF: -40 FVC: 1.62

## 2020-07-15 DIAGNOSIS — Z9189 Other specified personal risk factors, not elsewhere classified: Secondary | ICD-10-CM

## 2020-07-15 DIAGNOSIS — H02401 Unspecified ptosis of right eyelid: Secondary | ICD-10-CM

## 2020-07-15 DIAGNOSIS — G7001 Myasthenia gravis with (acute) exacerbation: Principal | ICD-10-CM

## 2020-07-15 DIAGNOSIS — I10 Essential (primary) hypertension: Secondary | ICD-10-CM

## 2020-07-15 DIAGNOSIS — Z794 Long term (current) use of insulin: Secondary | ICD-10-CM

## 2020-07-15 DIAGNOSIS — R262 Difficulty in walking, not elsewhere classified: Secondary | ICD-10-CM

## 2020-07-15 DIAGNOSIS — E1140 Type 2 diabetes mellitus with diabetic neuropathy, unspecified: Secondary | ICD-10-CM

## 2020-07-15 DIAGNOSIS — I6521 Occlusion and stenosis of right carotid artery: Secondary | ICD-10-CM

## 2020-07-15 LAB — BASIC METABOLIC PANEL
Anion gap: 8 (ref 5–15)
BUN: 21 mg/dL (ref 8–23)
CO2: 28 mmol/L (ref 22–32)
Calcium: 8.8 mg/dL — ABNORMAL LOW (ref 8.9–10.3)
Chloride: 100 mmol/L (ref 98–111)
Creatinine, Ser: 0.63 mg/dL (ref 0.44–1.00)
GFR, Estimated: >60 mL/min (ref >60)
Glucose, Bld: 108 mg/dL — ABNORMAL HIGH (ref 70–99)
Potassium: 4 mmol/L (ref 3.5–5.1)
Sodium: 136 mmol/L (ref 135–145)

## 2020-07-15 LAB — GLUCOSE, CAPILLARY
Glucose-Capillary: 110 mg/dL — ABNORMAL HIGH (ref 70–99)
Glucose-Capillary: 155 mg/dL — ABNORMAL HIGH (ref 70–99)

## 2020-07-15 LAB — MAGNESIUM: Magnesium: 1.9 mg/dL (ref 1.7–2.4)

## 2020-07-15 NOTE — Discharge Summary (Signed)
Physician Discharge Summary  Jennifer Shelton WFU:[id number] DOB: Sep 16, 1945 DOA: 07/09/2020  PCP: Rusty Aus, MD  Admit date: 07/09/2020 Discharge date: 07/15/2020  Admitted From: Home. Disposition: Home  Recommendations for Outpatient Follow-up:  1. Follow ups as below. 2. Recommended follow-up with neurology in 1 to 2 weeks 3. Could benefit from myasthenia gravis treatment such as Mestinon 4. Please facilitate outpatient referral to physical therapy 5. Avoid drugs that could increase her risk of myasthenia gravis exacerbation. 6. Patient is at risk for polypharmacy.  Reviewed pain medications at follow-up 7. Please obtain CBC/BMP/Mag at follow up 8. Please follow up on the following pending results: None  Home Health: None Equipment/Devices: None required  Discharge Condition: Stable CODE STATUS: Full code   Follow-up Information    Rusty Aus, MD. Schedule an appointment as soon as possible for a visit in 1 week.   Specialty: Internal Medicine Why: Patient to make own follow up appt due to office being closed Contact information: [place] [address] [phone]                Hospital Course: 75 year old F with PMH of DM-2, right CEA, PAD, HTN, HLD, myasthenia gravis and GERD presented to ED with 3 to 4 weeks of severe imbalance and gait instability with acute binocular diplopia, and admitted for myasthenia gravis exacerbation.  She also tested positive for COVID-19.  She is unvaccinated.  She was treated with IVIG infusion for 5 days per neurology recommendation.  She also received remdesivir for 3 days for COVID-19 infection.  Patient symptoms improved with the above treatments.  She was cleared for discharge by neurology for myasthenia gravis standpoint.  She was advised to continue isolation precautions for a total of 10 days from 07/09/2020.  She was also encouraged to have her COVID-19 vaccination  in about 3 to 4 weeks.  She was given handout on medications to be avoided to reduce the risk of myasthenia gravis exacerbation.  Patient was evaluated by therapy who recommended outpatient PT and rolling walker.  Dilator was ordered on discharge.  Please facilitate outpatient PT referral.   See individual problem list below for more hospital course. Discharge Diagnoses:  Gait instability/acute binocular diplopia/myasthenia gravis exacerbation -CT and MRI brain without acute intracranial finding.  B15, TSH, folic acid was normal.  She had severe hypomagnesemia which has been replenished. -Received IVIG infusion for 5 days with improvement in his symptoms. -Cleared for discharge by neurology -Discontinued home Coreg -Could benefit from EMG treatment such as Mestinon but defer this to her neurologist.  COVID-19 infection: No cardiopulmonary symptoms.  She is unvaccinated. -Received remdesivir infusion for 3 days. -Encouraged to have a COVID-19 vaccination about 3 to 4 weeks. -Patient to continue isolation for a total of 10 days from 07/09/2020.  Uncontrolled IDDM-2 with hyperglycemia: A1c 7.5%. No results for input(s): HGBA1C in the last 72 hours. Recent Labs  Lab 07/14/20 1205 07/14/20 1705 07/14/20 2115 07/15/20 0811 07/15/20 1136  GLUCAP 184* 141* 137* 110* 155*  -Discharged on home medications.  Hypomagnesemia: Resolved.  Hyperlipidemia -Continue home statin.  Essential hypertension: Normotensive -Continue home meds except Coreg which was discontinued given history of myasthenia with exacerbation  PAD/history of right CEA: Stable -Continue home medications.  At risk of polypharmacy: -Please review her medication list at follow-up.   Body mass index is 32.06 kg/m.            Discharge Exam: Vitals:  07/15/20 0813 07/15/20 1100  BP: (!) 134/48 (!) 131/52  Pulse: 68 71  Resp: 18 20  Temp: 98 F (36.7 C) 97.8 F (36.6 C)  SpO2: 97% 98%    GENERAL: No  apparent distress.  Nontoxic. HEENT: MMM.  Vision and hearing grossly intact.  NECK: Supple.  No apparent JVD.  RESP:  No IWOB.  Fair aeration bilaterally. CVS:  RRR. Heart sounds normal.  ABD/GI/GU: Bowel sounds present. Soft. Non tender.  MSK/EXT:  Moves extremities. No apparent deformity. No edema.  SKIN: no apparent skin lesion or wound NEURO: Awake, alert and oriented appropriately.  No apparent focal neuro deficit. PSYCH: Calm. Normal affect.   Discharge Instructions  Discharge Instructions    Call MD for:  difficulty breathing, headache or visual disturbances   Complete by: As directed    Call MD for:  extreme fatigue   Complete by: As directed    Call MD for:  persistant dizziness or light-headedness   Complete by: As directed    Call MD for:  temperature >100.4   Complete by: As directed    Diet - low sodium heart healthy   Complete by: As directed    Diet Carb Modified   Complete by: As directed    Discharge instructions   Complete by: As directed    It has been a pleasure taking care of you!  You were hospitalized due to myasthenia gravis exacerbation.  You were treated with IVIG infusion monthly symptoms.  Please follow-up with your neurologist in 1 to 2 weeks.  You also tested positive for COVID-19 infection, and received remdesivir infusion to decrease viral multiplication.   You are still potentially infectious at least for 10 days from the date you tested positive (07/09/2020)..  We recommend you isolate yourself and take the necessary precautions to prevent the virus from spreading during this period.  We also recommend getting you COVID-19 vaccination 3 to 4 weeks from the date you tested positive.  Some of the steps to prevent the virus from spreading to others: Stay away from other and members of your household until you fully recover or for 21 days after you tested positive.  Let healthy household members care for children and pets, if possible. If you have to  care for children or pets, wash your hands often and wear a mask. If possible, stay in your own room, separate from others. Use a different bathroom.Make sure that all people in your household wash their hands well and often. Leave your house only to seek medical care. Do not use public transport. Do not travel while you are sick. Wash your hands often with soap and water for 20 seconds. If soap and water are not available, use alcohol-based hand sanitizer. Cough or sneeze into a tissue or your sleeve or elbow. Do not cough or sneeze into your hand or into the air. Wear a cloth face covering or face mask.  Return precautions: Get help or return to the hospital right away if: You have trouble breathing. You have pain or pressure in your chest. You have confusion. You have bluish lips and fingernails. You have difficulty waking from sleep. You have symptoms that get worse. These symptoms may represent a serious problem that is an emergency. Do not wait to see if the symptoms will go away. Get medical help right away. Call your local emergency services (911 in the [place] Do not drive yourself to the hospital. Let the emergency medical personnel know if you  think you have COVID-19.  To protect yourself in the future:  Do not travel to areas where COVID-19 is a risk. The areas where COVID-19 is reported change often. To identify high-risk areas and travel restrictions, check the CDC travel website: FatFares.com.br If you live in, or must travel to, an area where COVID-19 is a risk, take precautions to avoid infection. Stay away from people who are sick. Wash your hands often with soap and water for 20 seconds. If soap and water are not available, use an alcohol-based hand sanitizer. Avoid touching your mouth, face, eyes, or nose. Avoid going out in public, follow guidance from your state and local health authorities. If you must go out in public, wear a cloth face covering or face  mask. Disinfect objects and surfaces that are frequently touched every day. This may include: Counters and tables. Doorknobs and light switches. Sinks and faucets. Electronics, such as phones, remote controls, keyboards, computers, and tablets.    Where to find more information Centers for Disease Control and Prevention: PurpleGadgets.be World Health Organization: https://www.castaneda.info/           Take care,   Increase activity slowly   Complete by: As directed      Allergies as of 07/15/2020      Reactions   Metformin And Related Diarrhea, Nausea And Vomiting   Plavix [clopidogrel Bisulfate] Hives      Medication List    STOP taking these medications   carvedilol 3.125 MG tablet Commonly known as: COREG     TAKE these medications   albuterol 108 (90 Base) MCG/ACT inhaler Commonly known as: VENTOLIN HFA Inhale 1-2 puffs into the lungs every 6 (six) hours as needed for wheezing or shortness of breath.   amLODipine 5 MG tablet Commonly known as: NORVASC Take 5 mg by mouth at bedtime.   ascorbic acid 1000 MG tablet Commonly known as: VITAMIN C Take 1,000 mg by mouth daily.   aspirin 325 MG tablet Take 325 mg by mouth daily.   b complex vitamins tablet Take 1 tablet by mouth daily.   baclofen 10 MG tablet Commonly known as: LIORESAL Take 20 mg by mouth at bedtime.   cholecalciferol 25 MCG (1000 UNIT) tablet Commonly known as: VITAMIN D Take 1,000 Units by mouth daily.   cimetidine 200 MG tablet Commonly known as: TAGAMET Take 200 mg by mouth at bedtime.   diphenhydrAMINE 25 mg capsule Commonly known as: BENADRYL Take 25-50 mg by mouth at bedtime.   fexofenadine 180 MG tablet Commonly known as: ALLEGRA Take 360 mg by mouth daily.   FLUoxetine 20 MG capsule Commonly known as: PROZAC Take 20 mg by mouth 3 (three) times daily.   fluticasone 50 MCG/ACT nasal spray Commonly known as: FLONASE Place 1-2  sprays into both nostrils daily as needed for congestion or allergies.   gabapentin 100 MG capsule Commonly known as: NEURONTIN Take 200 mg by mouth at bedtime.   glimepiride 2 MG tablet Commonly known as: AMARYL Take 2 mg by mouth daily with breakfast.   hydrOXYzine 25 MG tablet Commonly known as: ATARAX/VISTARIL Take 25 mg by mouth 3 (three) times daily as needed for itching or anxiety.   insulin glargine 100 UNIT/ML injection Commonly known as: LANTUS Inject 50 Units into the skin at bedtime.   methylphenidate 10 MG tablet Commonly known as: RITALIN Take 10 mg by mouth 2 (two) times daily.   montelukast 10 MG tablet Commonly known as: SINGULAIR Take 10 mg by mouth  at bedtime.   olmesartan-hydrochlorothiazide 40-12.5 MG tablet Commonly known as: BENICAR HCT Take 1 tablet by mouth daily.   rosuvastatin 20 MG tablet Commonly known as: CRESTOR Take 20 mg by mouth daily.            Durable Medical Equipment  (From admission, onward)         Start     Ordered   07/13/20 1558  For home use only DME Walker rolling  Once       Question Answer Comment  Walker: With 5 Inch Wheels   Patient needs a walker to treat with the following condition Myasthenia gravis ([place])      07/13/20 1557          Consultations:  Neurology  Procedures/Studies:   CT HEAD WO CONTRAST  Result Date: 07/09/2020 CLINICAL DATA:  Neuro deficit, acute, stroke suspected. Additional history provided: Stumbling, gait difficulty for 3 weeks, blurred vision this morning. EXAM: CT HEAD WITHOUT CONTRAST TECHNIQUE: Contiguous axial images were obtained from the base of the skull through the vertex without intravenous contrast. COMPARISON:  CT angiogram head/neck 07/19/2019. Brain MRI 07/19/2019. FINDINGS: Brain: Cerebral volume is normal for age. Mild patchy and ill-defined hypoattenuation within the cerebral white matter is nonspecific, but compatible with chronic small vessel ischemic disease.  Redemonstrated bilateral basal ganglia calcifications. There is no acute intracranial hemorrhage. No demarcated cortical infarct. No extra-axial fluid collection. No evidence of intracranial mass. No midline shift. Vascular: No hyperdense vessel.  Atherosclerotic calcifications. Skull: Normal. Negative for fracture or focal suspicious osseous lesion. Sinuses/Orbits: Visualized orbits show no acute finding. No significant paranasal sinus disease at the imaged levels. IMPRESSION: No evidence of acute intracranial abnormality. Stable mild cerebral white matter chronic small vessel ischemic disease. Electronically Signed   By: Kellie Simmering DO   On: 07/09/2020 10:48   MR BRAIN WO CONTRAST  Result Date: 07/09/2020 CLINICAL DATA:  Diplopia. EXAM: MRI HEAD WITHOUT CONTRAST TECHNIQUE: Multiplanar, multiecho pulse sequences of the brain and surrounding structures were obtained without intravenous contrast. COMPARISON:  Head CT 07/09/2020. CT angiogram head/neck 07/19/2019. Brain MRI 07/19/2019. FINDINGS: Brain: Cerebral volume is normal for age. Moderate multifocal T2/FLAIR hyperintensity within the cerebral white matter and pons is nonspecific, but compatible with chronic small vessel ischemic disease. There are a few scattered nonspecific chronic microhemorrhages within the right frontal lobe which are new as compared to the prior brain MRI of 07/19/2019. There is no acute infarct. No evidence of intracranial mass. No extra-axial fluid collection. No midline shift. Vascular: Expected proximal arterial flow voids. Skull and upper cervical spine: No focal marrow lesion. Sinuses/Orbits: Visualized orbits show no acute finding. Trace bilateral ethmoid sinus and left maxillary sinus mucosal thickening. IMPRESSION: No evidence of acute intracranial abnormality. 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 There are a few scattered chronic  microhemorrhages within the right frontal lobe, which are new from the prior brain MRI. Electronically Signed   By: Kellie Simmering DO   On: 07/09/2020 14:29   MR BRAIN W CONTRAST  Result Date: 07/09/2020 CLINICAL DATA:  Diplopia. EXAM: MRI HEAD WITH CONTRAST TECHNIQUE: Multiplanar, multiecho pulse sequences of the brain and surrounding structures were obtained with intravenous contrast. CONTRAST:  80mL GADAVIST GADOBUTROL 1 MMOL/ML IV SOLN COMPARISON:  Noncontrast study earlier same day. FINDINGS: Brain: Repeat diffusion imaging does not show any acute or subacute infarction. Postcontrast imaging is normal. No abnormal enhancement of the  brain or leptomeninges. No visible orbital pathology as seen. Vascular: Major vessels at the base of the brain show flow. Skull and upper cervical spine: Negative Sinuses/Orbits: Clear/normal Other: None IMPRESSION: Normal postcontrast imaging. No abnormality seen to explain the presenting symptoms. Electronically Signed   By: Nelson Chimes M.D.   On: 07/09/2020 22:14        The results of significant diagnostics from this hospitalization (including imaging, microbiology, ancillary and laboratory) are listed below for reference.     Microbiology: Recent Results (from the past 240 hour(s))  SARS CORONAVIRUS 2 (TAT 6-24 HRS) Nasopharyngeal Nasopharyngeal Swab     Status: Abnormal   Collection Time: 07/09/20  2:30 PM   Specimen: Nasopharyngeal Swab  Result Value Ref Range Status   SARS Coronavirus 2 POSITIVE (A) NEGATIVE Final    Comment: (NOTE) SARS-CoV-2 target nucleic acids are DETECTED.  The SARS-CoV-2 RNA is generally detectable in upper and lower respiratory specimens during the acute phase of infection. Positive results are indicative of the presence of SARS-CoV-2 RNA. Clinical correlation with patient history and other diagnostic information is  necessary to determine patient infection status. Positive results do not rule out bacterial infection or  co-infection with other viruses.  The expected result is Negative.  Fact Sheet for Patients: SugarRoll.be  Fact Sheet for Healthcare Providers: https://www.woods-mathews.com/  This test is not yet approved or cleared by the [place] FDA and  has been authorized for detection and/or diagnosis of SARS-CoV-2 by FDA under an Emergency Use Authorization (EUA). This EUA will remain  in effect (meaning this test can be used) for the duration of the COVID-19 declaration under Section 564(b)(1) of the Act, 21 U. S.C. section 360bbb-3(b)(1), unless the authorization is terminated or revoked sooner.   Performed at [facility], [place] [address] [place], [place] [address]      Labs:  CBC: Recent Labs  Lab 07/09/20 1019 07/12/20 0544 07/14/20 0516  WBC 7.5 5.8 5.4  NEUTROABS 4.4  --   --   HGB 14.3 14.0 13.4  HCT 43.8 40.6 39.7  MCV 82.2 80.9 81.2  PLT 185 173 172   BMP &GFR Recent Labs  Lab 07/09/20 1019 07/12/20 0544 07/14/20 0516 07/15/20 0607  NA 137 137 135 136  K 4.0 3.6 3.8 4.0  CL 102 104 102 100  CO2 25 26 29 28   GLUCOSE 138* 141* 161* 108*  BUN 18 18 18 21   CREATININE 0.74 0.61 0.70 0.63  CALCIUM 9.2 9.3 9.0 8.8*  MG  --   --  1.3* 1.9   Estimated Creatinine Clearance: 62.1 mL/min (by C-G formula based on SCr of 0.63 mg/dL). Liver & Pancreas: Recent Labs  Lab 07/09/20 1019 07/12/20 0544  AST 27 20  ALT 28 22  ALKPHOS 64 47  BILITOT 1.1 0.5  PROT 7.6 7.5  ALBUMIN 4.4 3.6   No results for input(s): LIPASE, AMYLASE in the last 168 hours. No results for input(s): AMMONIA in the last 168 hours. Diabetic: No results for input(s): HGBA1C in the last 72 hours. Recent Labs  Lab 07/14/20 1205 07/14/20 1705 07/14/20 2115 07/15/20 0811 07/15/20 1136  GLUCAP 184* 141* 137* 110* 155*   Cardiac Enzymes: No results for input(s): CKTOTAL, CKMB, CKMBINDEX, TROPONINI in the last 168 hours. No results for  input(s): PROBNP in the last 8760 hours. Coagulation Profile: Recent Labs  Lab 07/09/20 1019  INR 1.0   Thyroid Function Tests: No results for input(s): TSH, T4TOTAL, FREET4, T3FREE, THYROIDAB  in the last 72 hours. Lipid Profile: No results for input(s): CHOL, HDL, LDLCALC, TRIG, CHOLHDL, LDLDIRECT in the last 72 hours. Anemia Panel: No results for input(s): VITAMINB12, FOLATE, FERRITIN, TIBC, IRON, RETICCTPCT in the last 72 hours. Urine analysis: No results found for: COLORURINE, APPEARANCEUR, LABSPEC, PHURINE, GLUCOSEU, HGBUR, BILIRUBINUR, KETONESUR, PROTEINUR, UROBILINOGEN, NITRITE, LEUKOCYTESUR Sepsis Labs: Invalid input(s): PROCALCITONIN, LACTICIDVEN   Time coordinating discharge: 40 minutes  SIGNED:  Mercy Riding, MD  [facility] 07/15/2020, 5:34 PM  If 7PM-7AM, please contact night-coverage [email or web address]

## 2020-07-15 NOTE — Care Management Important Message (Signed)
Important Message  Patient Details  Name: Jennifer Shelton MRN: [id number] Date of Birth: Oct 15, 1945   Medicare Important Message Given:  Yes     Jennifer Shelton 07/15/2020, 12:37 PM

## 2020-07-15 NOTE — Discharge Instructions (Signed)
Drugs that may unmask or worsen myasthenia gravis Drugs that may unmask or worsen myasthenia gravis  Anesthetic agents  Neuromuscular blocking agents  Antibiotics  Aminoglycosides (eg, gentamicin, neomycin, tobramycin)  Fluoroquinolones (eg, ciprofloxacin, levofloxacin, norfloxacin)  Ketolides? (eg, telithromycin)  Macrolides (eg, azithromycin, clarithromycin, erythromycin)  Cardiovascular drugs  Beta blockers (eg, atenolol, labetalol, metoprolol, propranolol)  Procainamide  Quinidine  Other drugs  Anti-PD-1 monoclonal antibodies (eg, nivolumab and pembrolizumab)  Botulinum toxin  Chloroquine  Hydroxychloroquine  Magnesium  Penicillamine  Quinine   Drugs usually well tolerated in myasthenia gravis but occasionally associated with an exacerbation*  Anesthetic agents  Inhalation anesthetics (eg, isoflurane, halothane)  Local anesthetics? (eg, lidocaine, procaine)  Antibiotics and antiviral agents  Antiretroviral agents (eg, ritonavir)  Clindamycin  Metronidazole  Nitrofurantoin  Tetracyclines (eg, doxycycline, tetracycline)  Vancomycin  Antiseizure medications  Carbamazepine  Ethosuximide  Gabapentin  Phenobarbital  Phenytoin  Antipsychotics and other psychiatric drugs  Butyrophenones (eg, haloperidol)  Lithium  Phenothiazines (eg, chlorpromazine, prochlorperazine)  Glucocorticoids  Dexamethasone  Methylprednisolone  Prednisone  Ophthalmic drugs  Betaxolol  Echothiophate  Proparacaine  Timolol  Tropicamide  Other drugs  Cisplatinum  Emetine (Ipecac syrup)  Fludarabine  Glatiramer acetate  HMG CoA reductase inhibitors (statins)  Interferon alpha  Interleukin-2  Iodinated contrast agents  Riluzole    PD-1: programmed death receptor-1; HMG CoA: hydroxymethylglutaryl coenzyme A; IVIG: intravenous immune globulin. * This is not a complete list of all drugs that may, in individual patients, adversely affect neuromuscular transmission. Refer to UpToDate  topics for further information.  Only when necessary in hospitalized patients and with caution for respiratory muscle weakness. ? When administered intravenously. ? Contraindicated in myasthenia gravis.  Also used as antiemetics.  Although glucocorticoids are a common treatment for myasthenia gravis, at high doses they may cause a significant exacerbation of myasthenia gravis symptoms during early stages of treatment. For this reason, glucocorticoids should be started in high doses only in hospitalized patients who are receiving concurrent plasmapheresis or IVIG for myasthenic crisis. Graphic [phone] Version 7.0  2022 UpToDate, Inc. and/or its affiliates. All Rights Reserved.    Myasthenia Gravis Myasthenia gravis (MG) is a long-term (chronic) condition that causes weakness in the muscles you can control (voluntary muscles). MG can affect any voluntary muscle. The muscles most often affected are the ones that control:  Eye movement.  Facial movements.  Swallowing. MG is a disease in which the body's disease-fighting system (immune system) attacks its own healthy tissues (autoimmune disease). When you have MG, your immune system makes proteins (antibodies) that block a chemical (acetylcholine) that your body needs to send nerve signals to your muscles. This causes muscle weakness. What are the causes? The exact cause of MG is not known. What increases the risk? The following factors may make you more likely to develop this condition:  Having an enlarged thymus gland. The thymus gland is located under the breastbone. It makes certain cells for the immune system.  Having a family history of MG.   What are the signs or symptoms? Symptoms of MG may include:  Drooping eyelids.  Double vision.  Muscle weakness that gets worse with activity and gets better after rest.  Difficulty walking.  Trouble chewing and swallowing.  Trouble making facial expressions.  Slurred  speech.  Weakness of the arms, hands, and legs. Sudden, severe difficulty breathing (myasthenic crisis) may develop after having:  An infection.  A fever.  A bad reaction to a medicine. Myasthenic crisis requires emergency breathing support. Sometimes symptoms  of MG go away for a while (remission) and then come back later. How is this diagnosed? This condition may be diagnosed based on:  Your symptoms and medical history.  A physical exam.  Blood tests.  Tests of your muscle strength and function.  Imaging tests, such as a CT scan or an MRI. How is this treated? The goal of treatment is to improve muscle strength. Treatment may include:  Taking medicine.  Making lifestyle changes that focus on saving your energy.  Doing physical therapy to gain strength.  Having surgery to remove the thymus gland (thymectomy). This may result in a long remission for some people.  Having a procedure to remove the acetylcholine antibodies (plasmapheresis).  Getting emergency breathing support, if you experience myasthenic crisis. If you experience remission, you may be able to stop treatment and then resume treatment when your symptoms return. Follow these instructions at home:  Take over-the-counter and prescription medicines only as told by your health care provider.  Get plenty of rest and sleep. Take frequent breaks to rest your eyes, especially when in bright light or working on a computer.  Maintain a healthy diet and a healthy weight. Work with your health care provider or a diet and nutrition specialist (dietitian) if you need help.  Do exercises as told by your health care provider or physical therapist.  Do not use any products that contain nicotine or tobacco, such as cigarettes and e-cigarettes. If you need help quitting, ask your health care provider.  Prevent infections by: ? Washing your hands often with soap and water. If soap and water are not available, use hand  sanitizer. ? Avoiding contact with other people who are sick. ? Avoiding touching your eyes, nose, and mouth. ? Cleaning surfaces in your home that are touched often using a disinfectant.  Keep all follow-up visits as told by your health care provider. This is important.   Contact a health care provider if:  Your symptoms change or get worse, especially after having a fever or infection. Get help right away if:  You have trouble breathing. Summary  Myasthenia gravis (MG) is a long-term (chronic) condition that causes weakness in the muscles you can control (voluntary muscles).  A symptom of MG is muscle weakness that gets worse with activity and gets better after rest.  Sudden, severe difficulty breathing (myasthenic crisis) may develop after having an infection, a fever, or a bad reaction to a medicine.  The goal of treatment is to improve muscle strength. Treatment may include medicines, lifestyle changes, physical therapy, surgery, plasmapheresis, or emergency breathing support. This information is not intended to replace advice given to you by your health care provider. Make sure you discuss any questions you have with your health care provider. Document Revised: 01/17/2020 Document Reviewed: 01/17/2020 Elsevier Patient Education  2021 [employer].

## 2020-07-15 NOTE — Progress Notes (Addendum)
Shift Summary:  Pt AOx4, VSS. Independent with toileting/ambulation in room. C/o headache addressed with PRN tylenol. Last IVIG infusion completed overnight; c/o jaw pain and hand/feet/joint pain with 3rd rate increase, reduced rate to 60mg /kg/hr and tolerated w/o issue for duration of infusion. Otherwise pt able to sleep majority of night. Rounding performed, safety precautions in place, needs/concerns addressed.

## 2020-07-15 NOTE — Progress Notes (Signed)
NIF: -40 FVC: 1.69

## 2020-07-15 NOTE — Progress Notes (Signed)
Subjective: No more double vision. Had some post IVIG headache that went away with tylenol. Walking around today. Objective: Current vital signs: BP (!) 131/52 (BP Location: Right Arm)   Pulse 71   Temp 97.8 F (36.6 C) (Oral)   Resp 20   Ht 5\' 2"  (1.575 m)   Wt 79.5 kg   SpO2 98%   BMI 32.06 kg/m  Vital signs in last 24 hours: Temp:  [97.7 F (36.5 C)-98.4 F (36.9 C)] 97.8 F (36.6 C) (03/02 1100) Pulse Rate:  [68-85] 71 (03/02 1100) Resp:  [15-20] 20 (03/02 1100) BP: (118-168)/(48-90) 131/52 (03/02 1100) SpO2:  [94 %-99 %] 98 % (03/02 1100)  Intake/Output from previous day: 03/01 0701 - 03/02 0700 In: 450 [P.O.:150; I.V.:300] Out: -  Intake/Output this shift: No intake/output data recorded. Nutritional status:  Diet Order            Diet - low sodium heart healthy           Diet Carb Modified           Diet Carb Modified Fluid consistency: Thin; Room service appropriate? Yes  Diet effective now                 HEENT: [place]/AT Lungs: Respirations unlabored Ext: No edema  Neurological Examination Mental Status:  Alert,fullyoriented, thought content appropriate. Speech fluent without evidence of aphasia. No dysarthria. Able to follow allcommands without difficulty. Cranial Nerves: II: Pupils equal. Fixates and tracks normally.  III,IV, VI: No ptosis. No gaze evoked diplopia. No nysatagmus. V,VII:Smile is symmetric; no "myasthenic snarl" noted. VIII:Hearingintact to voice. IX,X:No hoarseness or hypophonia. QV:[id number] XII:No lingual dysarthria Motor: BUE strength is 5/5. BLE strength is 5/5. Cerebellar:No ataxia noted Gait: Able to walk around with narrow base, short stride length and arm swing.  Lab Results: Results for orders placed or performed during the hospital encounter of 07/09/20 (from the past 48 hour(s))  Glucose, capillary     Status: Abnormal   Collection Time: 07/13/20 12:48 PM  Result Value Ref Range    Glucose-Capillary 172 (H) 70 - 99 mg/dL    Comment: Glucose reference range applies only to samples taken after fasting for at least 8 hours.  Glucose, capillary     Status: Abnormal   Collection Time: 07/13/20  3:52 PM  Result Value Ref Range   Glucose-Capillary 240 (H) 70 - 99 mg/dL    Comment: Glucose reference range applies only to samples taken after fasting for at least 8 hours.  Glucose, capillary     Status: Abnormal   Collection Time: 07/13/20  9:22 PM  Result Value Ref Range   Glucose-Capillary 127 (H) 70 - 99 mg/dL    Comment: Glucose reference range applies only to samples taken after fasting for at least 8 hours.  Basic metabolic panel     Status: Abnormal   Collection Time: 07/14/20  5:16 AM  Result Value Ref Range   Sodium 135 135 - 145 mmol/L   Potassium 3.8 3.5 - 5.1 mmol/L   Chloride 102 98 - 111 mmol/L   CO2 29 22 - 32 mmol/L   Glucose, Bld 161 (H) 70 - 99 mg/dL    Comment: Glucose reference range applies only to samples taken after fasting for at least 8 hours.   BUN 18 8 - 23 mg/dL   Creatinine, Ser 0.70 0.44 - 1.00 mg/dL   Calcium 9.0 8.9 - 10.3 mg/dL   GFR, Estimated >60 >60 mL/min    Comment: (  NOTE) Calculated using the CKD-EPI Creatinine Equation (2021)    Anion gap 4 (L) 5 - 15    Comment: Performed at [facility], [address] [place], [place] [address]  CBC     Status: None   Collection Time: 07/14/20  5:16 AM  Result Value Ref Range   WBC 5.4 4.0 - 10.5 K/uL   RBC 4.89 3.87 - 5.11 MIL/uL   Hemoglobin 13.4 12.0 - 15.0 g/dL   HCT 39.7 36.0 - 46.0 %   MCV 81.2 80.0 - 100.0 fL   MCH 27.4 26.0 - 34.0 pg   MCHC 33.8 30.0 - 36.0 g/dL   RDW 13.5 11.5 - 15.5 %   Platelets 172 150 - 400 K/uL   nRBC 0.0 0.0 - 0.2 %    Comment: Performed at [facility], [address], [place], [place] [address]  Magnesium     Status: Abnormal   Collection Time: 07/14/20  5:16 AM  Result Value Ref Range   Magnesium 1.3 (L) 1.7 - 2.4 mg/dL     Comment: Performed at [facility], [address] [place], [place] [address]  Glucose, capillary     Status: Abnormal   Collection Time: 07/14/20  8:21 AM  Result Value Ref Range   Glucose-Capillary 114 (H) 70 - 99 mg/dL    Comment: Glucose reference range applies only to samples taken after fasting for at least 8 hours.  Glucose, capillary     Status: Abnormal   Collection Time: 07/14/20 12:05 PM  Result Value Ref Range   Glucose-Capillary 184 (H) 70 - 99 mg/dL    Comment: Glucose reference range applies only to samples taken after fasting for at least 8 hours.  Glucose, capillary     Status: Abnormal   Collection Time: 07/14/20  5:05 PM  Result Value Ref Range   Glucose-Capillary 141 (H) 70 - 99 mg/dL    Comment: Glucose reference range applies only to samples taken after fasting for at least 8 hours.  Glucose, capillary     Status: Abnormal   Collection Time: 07/14/20  9:15 PM  Result Value Ref Range   Glucose-Capillary 137 (H) 70 - 99 mg/dL    Comment: Glucose reference range applies only to samples taken after fasting for at least 8 hours.  Basic metabolic panel     Status: Abnormal   Collection Time: 07/15/20  6:07 AM  Result Value Ref Range   Sodium 136 135 - 145 mmol/L   Potassium 4.0 3.5 - 5.1 mmol/L   Chloride 100 98 - 111 mmol/L   CO2 28 22 - 32 mmol/L   Glucose, Bld 108 (H) 70 - 99 mg/dL    Comment: Glucose reference range applies only to samples taken after fasting for at least 8 hours.   BUN 21 8 - 23 mg/dL   Creatinine, Ser 0.63 0.44 - 1.00 mg/dL   Calcium 8.8 (L) 8.9 - 10.3 mg/dL   GFR, Estimated >60 >60 mL/min    Comment: (NOTE) Calculated using the CKD-EPI Creatinine Equation (2021)    Anion gap 8 5 - 15    Comment: Performed at [facility], [address] [place], [place] [address]  Magnesium     Status: None   Collection Time: 07/15/20  6:07 AM  Result Value Ref Range   Magnesium 1.9 1.7 - 2.4 mg/dL    Comment: Performed at  [facility], [address] [place], [place] [address]  Glucose, capillary  Status: Abnormal   Collection Time: 07/15/20  8:11 AM  Result Value Ref Range   Glucose-Capillary 110 (H) 70 - 99 mg/dL    Comment: Glucose reference range applies only to samples taken after fasting for at least 8 hours.  Glucose, capillary     Status: Abnormal   Collection Time: 07/15/20 11:36 AM  Result Value Ref Range   Glucose-Capillary 155 (H) 70 - 99 mg/dL    Comment: Glucose reference range applies only to samples taken after fasting for at least 8 hours.    Recent Results (from the past 240 hour(s))  SARS CORONAVIRUS 2 (TAT 6-24 HRS) Nasopharyngeal Nasopharyngeal Swab     Status: Abnormal   Collection Time: 07/09/20  2:30 PM   Specimen: Nasopharyngeal Swab  Result Value Ref Range Status   SARS Coronavirus 2 POSITIVE (A) NEGATIVE Final    Comment: (NOTE) SARS-CoV-2 target nucleic acids are DETECTED.  The SARS-CoV-2 RNA is generally detectable in upper and lower respiratory specimens during the acute phase of infection. Positive results are indicative of the presence of SARS-CoV-2 RNA. Clinical correlation with patient history and other diagnostic information is  necessary to determine patient infection status. Positive results do not rule out bacterial infection or co-infection with other viruses.  The expected result is Negative.  Fact Sheet for Patients: SugarRoll.be  Fact Sheet for Healthcare Providers: https://www.woods-mathews.com/  This test is not yet approved or cleared by the [place] FDA and  has been authorized for detection and/or diagnosis of SARS-CoV-2 by FDA under an Emergency Use Authorization (EUA). This EUA will remain  in effect (meaning this test can be used) for the duration of the COVID-19 declaration under Section 564(b)(1) of the Act, 21 U. S.C. section 360bbb-3(b)(1), unless the authorization is terminated  or revoked sooner.   Performed at [facility], [place] [address] [place], [place] [address]     Lipid Panel No results for input(s): CHOL, TRIG, HDL, CHOLHDL, VLDL, LDLCALC in the last 72 hours.  Studies/Results: No results found.  Medications:  Scheduled: . ascorbic acid  1,000 mg Oral Daily  . aspirin  325 mg Oral Daily  . B-complex with vitamin C  1 tablet Oral Daily  . cholecalciferol  1,000 Units Oral Daily  . enoxaparin (LOVENOX) injection  40 mg Subcutaneous Q24H  . FLUoxetine  20 mg Oral TID  . fluticasone  1 spray Each Nare Daily  . folic acid  1 mg Oral Daily  . hydrochlorothiazide  25 mg Oral Daily   And  . irbesartan  300 mg Oral Daily  . insulin aspart  0-5 Units Subcutaneous QHS  . insulin aspart  0-9 Units Subcutaneous TID WC  . insulin glargine  50 Units Subcutaneous QHS  . methylphenidate  10 mg Oral BID  . montelukast  10 mg Oral QHS  . sodium chloride flush  3 mL Intravenous Q12H   Continuous: . sodium chloride Stopped (07/13/20 1900)    Assessment:75 year old female with a known history of myasthenia gravis (diagnosed 1 year ago by antibodies), not on Mestinon or immunomodulatory agent, presenting with a 3 week history of stumbling gait and subjective unsteadiness followed by acute onset of binocular diplopia.She has PVD with significant atherosclerotic changes seen on CTA of head and neck from last year. She is s/p right ICA stenting last year. Although not listed in Epic, she also endorses a procedure consistent with possible prior bilateral iliac stenting for BLE intermittent claudication symptoms. Stroke has been  ruled out with MRI. She has completed day 5/5 of IVIG with significant improvement. 1.Significant improvement in exam today. 2. MRI brain:No evidence of acute intracranial abnormality. 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 There are a few  scattered chronicmicrohemorrhages within the right frontal lobe, which are new from the prior brain MRI. Post contrast images are negative.  3. She has tested positive for Covid but is asymptomatic.She is currently on remdesivir.  Recommendations: - Completed 5 day course of IVIG. - Daily NIF and FVC with RT have been stable. - Medications that may worsen or trigger MG exacerbation: Class IA antiarrhythmics, magnesium, flouroquinolones, macrolides, aminoglycosides, penicillamine, curare, interferon alpha, botox, quinine. Use with caution: CCBs, BBs, statins. - Patient is being discharged today. Recommend follow up with neurology outpatient.   LOS: 4 days   [place] Pager Number [phone] 07/15/2020  12:31 PM

## 2020-07-15 NOTE — Progress Notes (Signed)
Discharge paperwork reviewed with patient. All questions answered. Verbalizes understanding of discharge plan, follow-up care and medication changes.

## 2021-01-06 ENCOUNTER — Other Ambulatory Visit: Payer: Self-pay | Admitting: "Endocrinology

## 2021-01-06 DIAGNOSIS — E278 Other specified disorders of adrenal gland: Secondary | ICD-10-CM

## 2021-02-04 ENCOUNTER — Ambulatory Visit
Admission: RE | Admit: 2021-02-04 | Discharge: 2021-02-04 | Disposition: A | Discharge status: Home / Self Care | Payer: Medicare Other | Source: Ambulatory Visit | Attending: "Endocrinology | Admitting: "Endocrinology

## 2021-02-04 ENCOUNTER — Other Ambulatory Visit: Payer: Self-pay

## 2021-02-04 DIAGNOSIS — E278 Other specified disorders of adrenal gland: Secondary | ICD-10-CM | POA: Insufficient documentation

## 2021-02-04 DIAGNOSIS — N281 Cyst of kidney, acquired: Secondary | ICD-10-CM | POA: Insufficient documentation

## 2021-02-04 DIAGNOSIS — I700 Atherosclerosis of aorta: Secondary | ICD-10-CM | POA: Diagnosis not present

## 2021-02-04 IMAGING — CT CT ABDOMEN WO/W CM
3 of 10 series · 12 of 46 positions shown, 18 images · IV contrast (APPLIED)
Comparison: None.

CLINICAL DATA: Evaluate adrenal nodule.

EXAM:
CT ABDOMEN WITHOUT AND WITH CONTRAST
TECHNIQUE: Multidetector CT imaging of the abdomen was performed following the
standard protocol before and following the bolus administration of
intravenous contrast.
CONTRAST:  80mL OMNIPAQUE IOHEXOL 350 MG/ML SOLN

[Series 2: axial st · axial · 0.75mm/px · z∈[-604,-400]mm · 8 of 88 slices shown, 13 images (1 of 2)]
[im 10/88  soft-tissue]
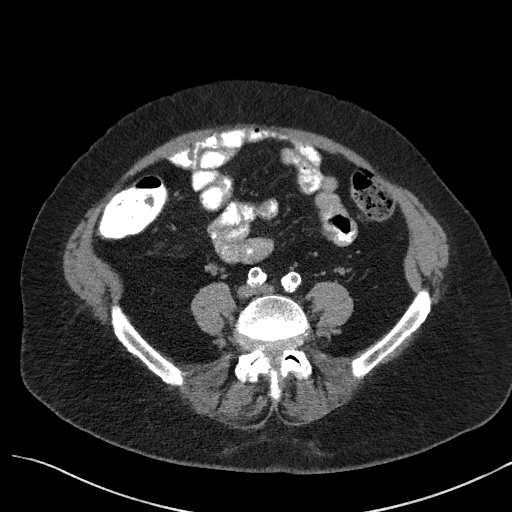
[im 10/88  bone]
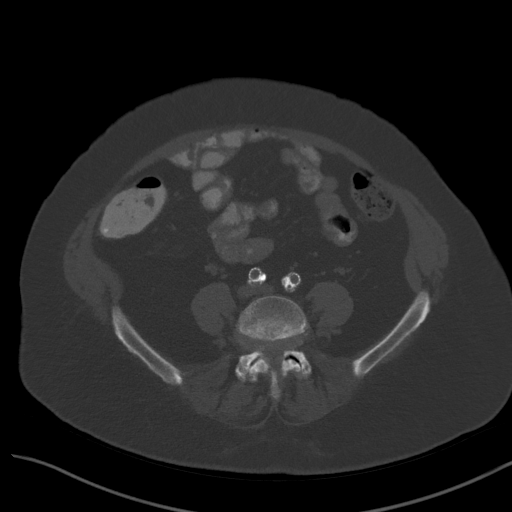
[im 20/88  soft-tissue]
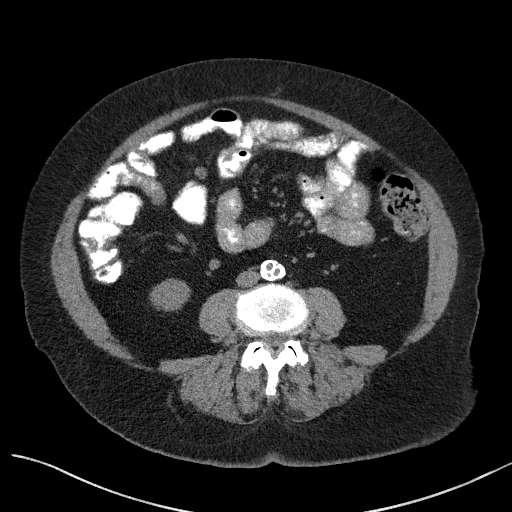
[im 30/88  soft-tissue]
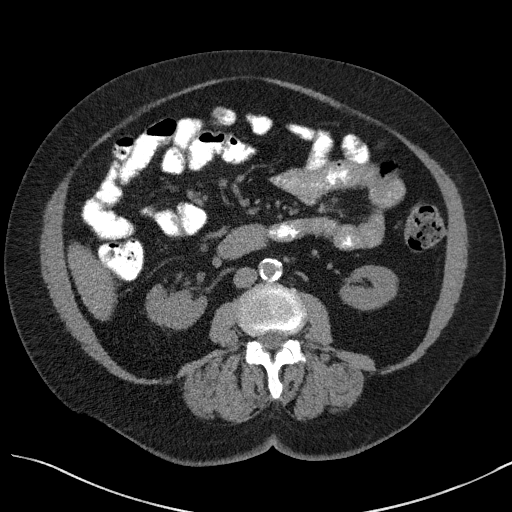
[im 39/88  soft-tissue]
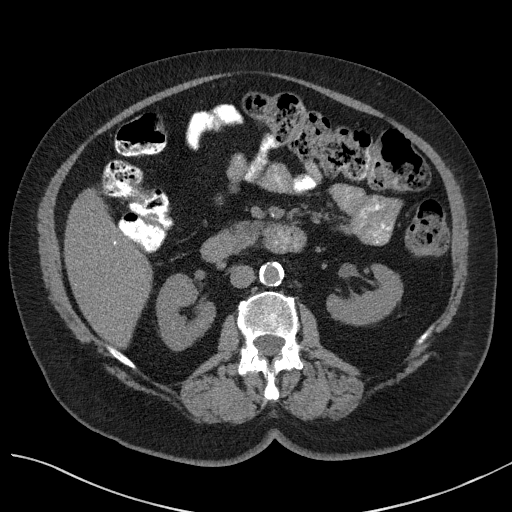
[im 49/88  soft-tissue]
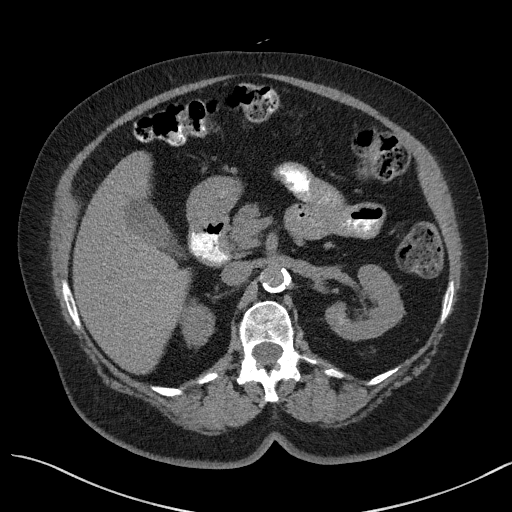
[im 49/88  lung]
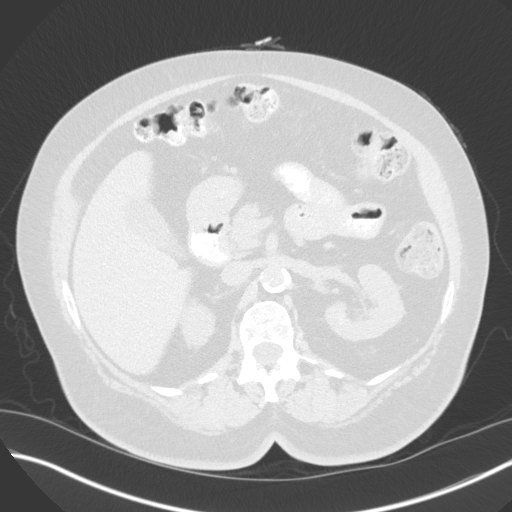
[im 59/88  soft-tissue]
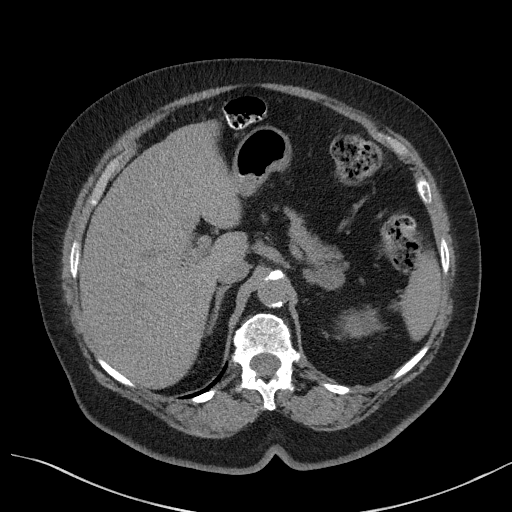
[im 59/88  lung]
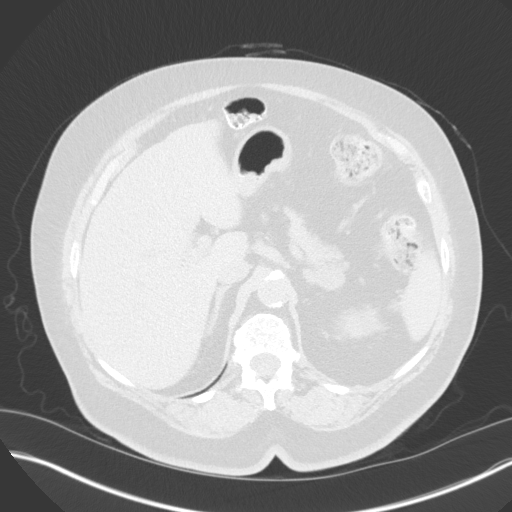
[im 68/88  soft-tissue]
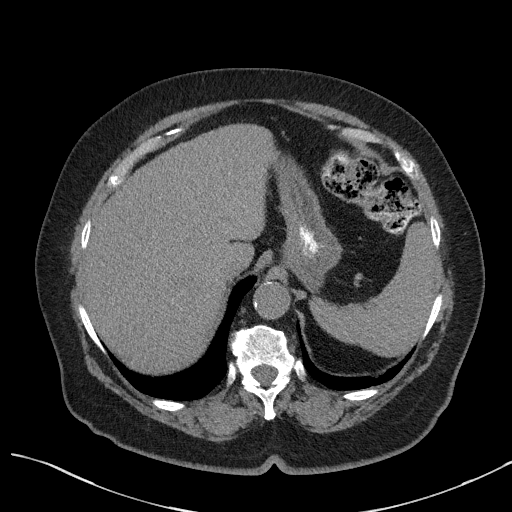
[im 68/88  lung]
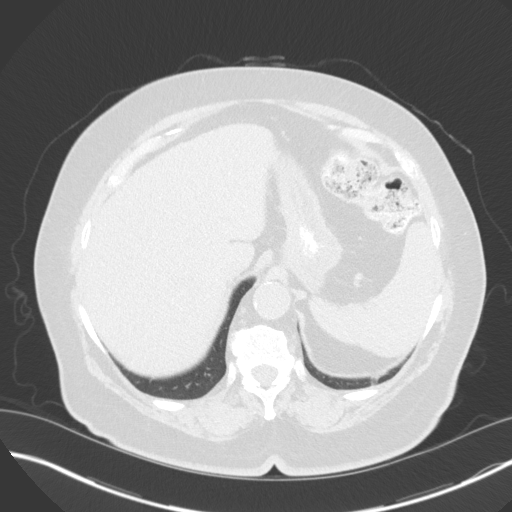
[im 78/88  soft-tissue]
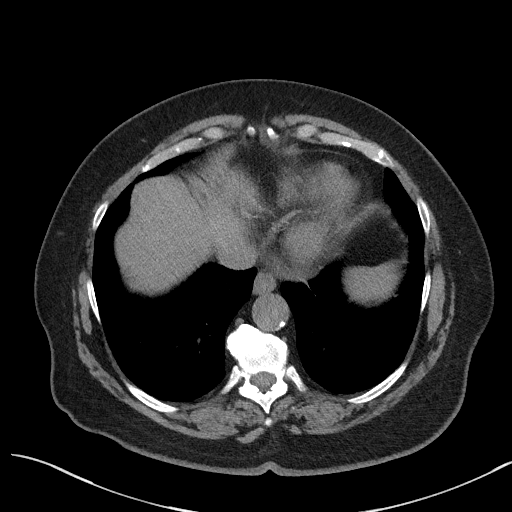
[im 78/88  lung]
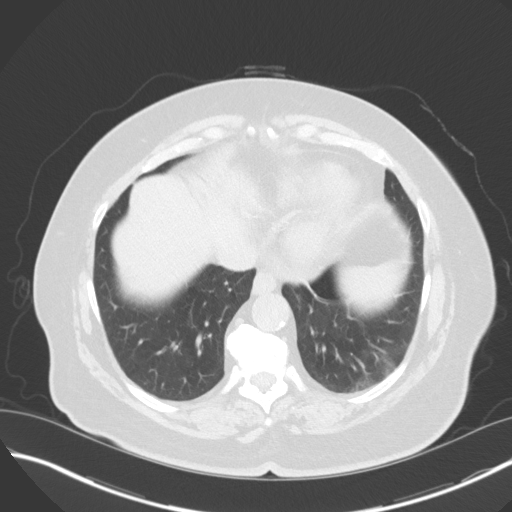

[Series 5: coronal wo · coronal · 0.53mm/px · 2 of 161 slices shown, 3 images]
[im 54/161  soft-tissue]
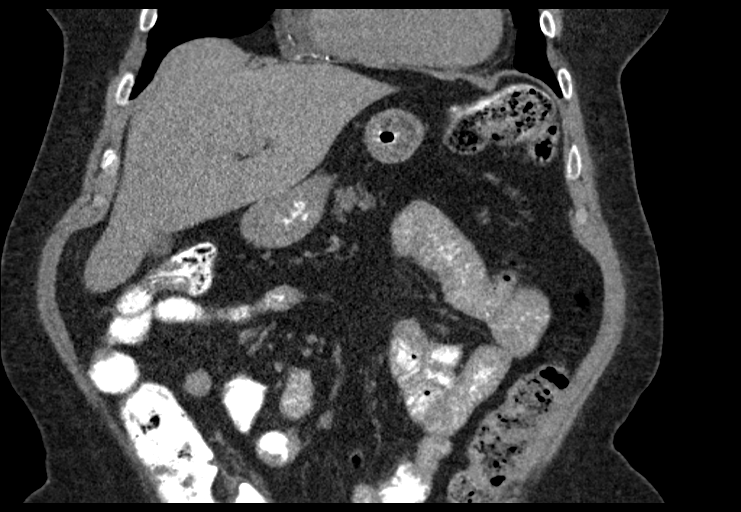
[im 54/161  bone]
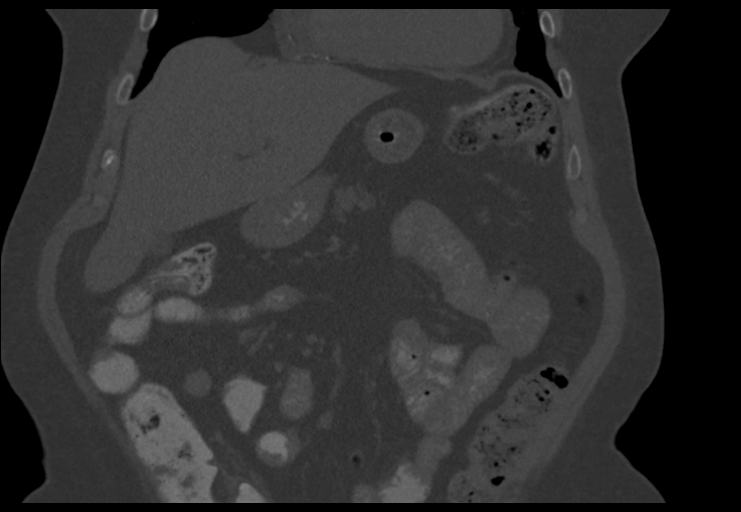
[im 107/161  soft-tissue]
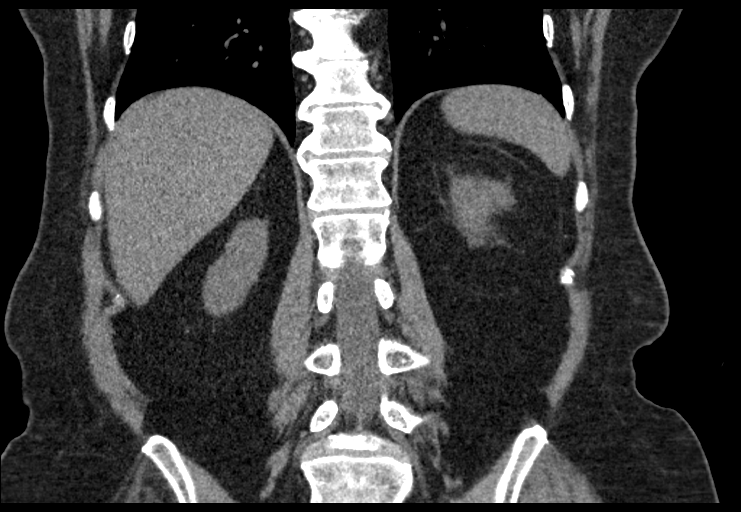

[Series 7: axial st · axial · 0.75mm/px · z∈[-601,-568]mm · 2 of 88 slices shown (2 of 2)]
[im 11/88  soft-tissue]
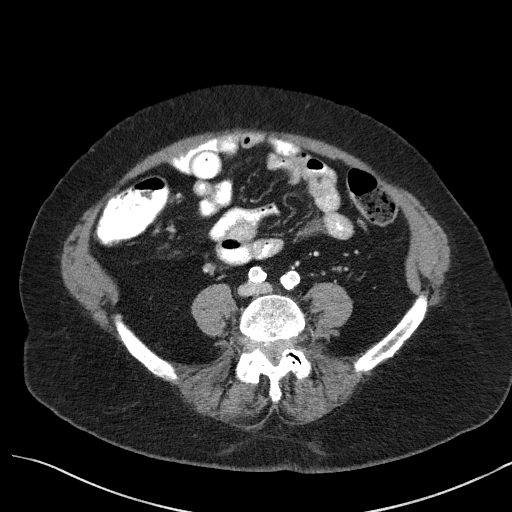
[im 22/88  soft-tissue]
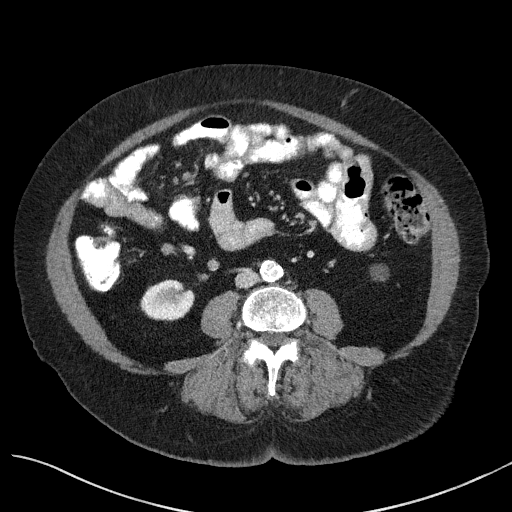

[12 of 46 positions shown; findings below may reference images not displayed]

FINDINGS: Lower chest: No acute abnormality.

Hepatobiliary: No focal liver abnormality is seen. No gallstones,
gallbladder wall thickening, or biliary dilatation.

Pancreas: Unremarkable. No pancreatic ductal dilatation or
surrounding inflammatory changes.

Spleen: Normal in size without focal abnormality.

Adrenals/Urinary Tract: Normal right adrenal gland. Left adrenal
nodule measures 2.1 by 1.8 cm. The absolute and relative contrast
washout values are compatible with a benign adenoma.

No kidney stone or hydronephrosis identified bilaterally. There are
2 left kidney cysts. The largest arises off the inferior pole
measuring 1.7 cm.

Stomach/Bowel: Stomach is within normal limits. Appendix appears
normal. No evidence of bowel wall thickening, distention, or
inflammatory changes.

Vascular/Lymphatic: Extensive aortic atherosclerosis without
aneurysm. No adenopathy.

Other: No abdominal wall hernia or abnormality.

Musculoskeletal: No acute or significant osseous findings.
IMPRESSION: 1. The left adrenal nodule has enhancement characteristics
compatible with a benign adenoma.
2. Left kidney cysts.
3. Aortic Atherosclerosis ([4J]-[4J]).

## 2021-02-04 MED ORDER — IOHEXOL 350 MG/ML SOLN
80.0000 mL | Freq: Once | INTRAVENOUS | Status: AC | PRN
  Administered 2021-02-04: 80 mL via INTRAVENOUS

## 2022-09-27 ENCOUNTER — Other Ambulatory Visit: Payer: Self-pay | Admitting: Orthopedic Surgery

## 2022-09-27 DIAGNOSIS — S83232A Complex tear of medial meniscus, current injury, left knee, initial encounter: Secondary | ICD-10-CM

## 2022-09-30 ENCOUNTER — Ambulatory Visit
Admission: RE | Admit: 2022-09-30 | Discharge: 2022-09-30 | Disposition: A | Discharge status: Home / Self Care | Payer: 59 | Source: Ambulatory Visit | Attending: Orthopedic Surgery | Admitting: Orthopedic Surgery

## 2022-09-30 DIAGNOSIS — S83232A Complex tear of medial meniscus, current injury, left knee, initial encounter: Secondary | ICD-10-CM | POA: Diagnosis present

## 2023-10-02 ENCOUNTER — Other Ambulatory Visit: Payer: Self-pay | Admitting: Surgery

## 2023-10-02 DIAGNOSIS — M1712 Unilateral primary osteoarthritis, left knee: Secondary | ICD-10-CM

## 2023-10-07 ENCOUNTER — Ambulatory Visit
Admission: RE | Admit: 2023-10-07 | Discharge: 2023-10-07 | Disposition: A | Discharge status: Home / Self Care | Source: Ambulatory Visit | Attending: Surgery

## 2023-10-07 DIAGNOSIS — M1712 Unilateral primary osteoarthritis, left knee: Secondary | ICD-10-CM | POA: Insufficient documentation

## 2023-11-22 ENCOUNTER — Other Ambulatory Visit: Payer: Self-pay | Admitting: Surgery

## 2023-11-24 ENCOUNTER — Encounter
Admission: RE | Admit: 2023-11-24 | Discharge: 2023-11-24 | Disposition: A | Discharge status: Home / Self Care | Source: Ambulatory Visit | Attending: Surgery | Admitting: Surgery

## 2023-11-24 ENCOUNTER — Other Ambulatory Visit: Payer: Self-pay

## 2023-11-24 ENCOUNTER — Encounter: Payer: Self-pay | Admitting: Urgent Care

## 2023-11-24 VITALS — BP 125/72 | HR 96 | Resp 12 | Ht 61.5 in | Wt 143.1 lb

## 2023-11-24 DIAGNOSIS — R8290 Unspecified abnormal findings in urine: Secondary | ICD-10-CM | POA: Diagnosis not present

## 2023-11-24 DIAGNOSIS — R8271 Bacteriuria: Secondary | ICD-10-CM | POA: Insufficient documentation

## 2023-11-24 DIAGNOSIS — Z01810 Encounter for preprocedural cardiovascular examination: Secondary | ICD-10-CM

## 2023-11-24 DIAGNOSIS — E119 Type 2 diabetes mellitus without complications: Secondary | ICD-10-CM | POA: Diagnosis not present

## 2023-11-24 DIAGNOSIS — Z01812 Encounter for preprocedural laboratory examination: Secondary | ICD-10-CM

## 2023-11-24 DIAGNOSIS — M1712 Unilateral primary osteoarthritis, left knee: Secondary | ICD-10-CM

## 2023-11-24 DIAGNOSIS — Z01818 Encounter for other preprocedural examination: Secondary | ICD-10-CM | POA: Insufficient documentation

## 2023-11-24 DIAGNOSIS — E871 Hypo-osmolality and hyponatremia: Secondary | ICD-10-CM | POA: Diagnosis not present

## 2023-11-24 DIAGNOSIS — I10 Essential (primary) hypertension: Secondary | ICD-10-CM | POA: Insufficient documentation

## 2023-11-24 HISTORY — DX: Unspecified asthma, uncomplicated: J45.909

## 2023-11-24 HISTORY — DX: Peripheral vascular disease, unspecified: I73.9

## 2023-11-24 HISTORY — DX: Irritable bowel syndrome, unspecified: K58.9

## 2023-11-24 HISTORY — DX: Myasthenia gravis without (acute) exacerbation: G70.00

## 2023-11-24 HISTORY — DX: Narcolepsy without cataplexy: G47.419

## 2023-11-24 HISTORY — DX: Chronic kidney disease, stage 3b: N18.32

## 2023-11-24 LAB — URINALYSIS, COMPLETE (UACMP) WITH MICROSCOPIC
Bilirubin Urine: NEGATIVE
Glucose, UA: NEGATIVE mg/dL
Hgb urine dipstick: NEGATIVE
Ketones, ur: NEGATIVE mg/dL
Nitrite: POSITIVE — AB
Protein, ur: NEGATIVE mg/dL
Specific Gravity, Urine: 1.014 (ref 1.005–1.030)
pH: 5 (ref 5.0–8.0)

## 2023-11-24 LAB — CBC
HCT: 39.2 % (ref 36.0–46.0)
Hemoglobin: 12.8 g/dL (ref 12.0–15.0)
MCH: 26.4 pg (ref 26.0–34.0)
MCHC: 32.7 g/dL (ref 30.0–36.0)
MCV: 81 fL (ref 80.0–100.0)
Platelets: 286 K/uL (ref 150–400)
RBC: 4.84 MIL/uL (ref 3.87–5.11)
RDW: 12.7 % (ref 11.5–15.5)
WBC: 13.8 K/uL — ABNORMAL HIGH (ref 4.0–10.5)
nRBC: 0 % (ref 0.0–0.2)

## 2023-11-24 LAB — BASIC METABOLIC PANEL WITH GFR
Anion gap: 10 (ref 5–15)
BUN: 24 mg/dL — ABNORMAL HIGH (ref 8–23)
CO2: 27 mmol/L (ref 22–32)
Calcium: 9.1 mg/dL (ref 8.9–10.3)
Chloride: 99 mmol/L (ref 98–111)
Creatinine, Ser: 0.89 mg/dL (ref 0.44–1.00)
GFR, Estimated: >60 mL/min (ref >60)
Glucose, Bld: 93 mg/dL (ref 70–99)
Potassium: 3.7 mmol/L (ref 3.5–5.1)
Sodium: 136 mmol/L (ref 135–145)

## 2023-11-24 LAB — SURGICAL PCR SCREEN
MRSA, PCR: NEGATIVE
Staphylococcus aureus: POSITIVE — AB

## 2023-11-24 NOTE — Patient Instructions (Addendum)
 Your procedure is scheduled on:12-05-23 Tuesday Report to the Registration Desk on the 1st floor of the [facility].Then proceed to the 2nd floor Surgery Desk To find out your arrival time, please call [phone] between 1PM - 3PM on:12-04-23 Monday If your arrival time is 6:00 am, do not arrive before that time as the [facility] entrance doors do not open until 6:00 am.  REMEMBER: Instructions that are not followed completely may result in serious medical risk, up to and including death; or upon the discretion of your surgeon and anesthesiologist your surgery may need to be rescheduled.  Do not eat food after midnight the night before surgery.  No gum chewing or hard candies.  You may however, drink Water   up to 2 hours before you are scheduled to arrive for your surgery. Do not drink anything within 2 hours of your scheduled arrival time.  In addition, your doctor has ordered for you to drink the provided:  Gatorade G2 Drinking this carbohydrate drink up to two hours before surgery helps to reduce insulin  resistance and improve patient outcomes. Please complete drinking 2 hours before scheduled arrival time.  One week prior to surgery:Last dose on 11-27-23  Stop Anti-inflammatories (NSAIDS) such as Advil, Aleve, Ibuprofen, Motrin, Naproxen, Naprosyn and Aspirin  based products such as Excedrin, Goody's Powder, BC Powder. Stop ANY OVER THE COUNTER supplements until after surgery (B Complex, Vitamin D, Melatonin)  You may however, continue to take Tylenol  if needed for pain up until the day of surgery.  Stop OZEMPIC 7 days prior to surgery-Do NOT take again until AFTER surgery  Stop your 325 mg Aspirin  7 days prior to surgery-Last dose will be on 11-27-23 (Monday). Start an 81 mg Aspirin  on 11-28-23 (Tuesday) and continue up until the day prior to surgery-Do NOT take the morning of surgery  Continue taking all of your other prescription medications up until the day of surgery.  ON THE  DAY OF SURGERY ONLY TAKE THESE MEDICATIONS WITH SIPS OF WATER : -FLUoxetine  (PROZAC )  -You may take hydrOXYzine  (ATARAX /VISTARIL ) for anxiety if needed  Do NOT take any Insulin  the morning of surgery  Use your Albuterol  Inhaler the day of surgery and bring your Inhaler to the hospital  No Alcohol  for 24 hours before or after surgery.  No Smoking including e-cigarettes for 24 hours before surgery.  No chewable tobacco products for at least 6 hours before surgery.  No nicotine patches on the day of surgery.  Do not use any recreational drugs for at least a week (preferably 2 weeks) before your surgery.  Please be advised that the combination of cocaine and anesthesia may have negative outcomes, up to and including death. If you test positive for cocaine, your surgery will be cancelled.  On the morning of surgery brush your teeth with toothpaste and water , you may rinse your mouth with mouthwash if you wish. Do not swallow any toothpaste or mouthwash.  Use CHG Soap as directed on instruction sheet.  Do not wear jewelry, make-up, hairpins, clips or nail polish.  For welded (permanent) jewelry: bracelets, anklets, waist bands, etc.  Please have this removed prior to surgery.  If it is not removed, there is a chance that hospital personnel will need to cut it off on the day of surgery.  Do not wear lotions, powders, or perfumes.   Do not shave body hair from the neck down 48 hours before surgery.  Contact lenses, hearing aids and dentures may not be worn into surgery.  Do not bring valuables to the hospital. [facility] is not responsible for any missing/lost belongings or valuables.   Notify your doctor if there is any change in your medical condition (cold, fever, infection).  Wear comfortable clothing (specific to your surgery type) to the hospital.  After surgery, you can help prevent lung complications by doing breathing exercises.  Take deep breaths and cough every 1-2 hours.  Your doctor may order a device called an Incentive Spirometer to help you take deep breaths. When coughing or sneezing, hold a pillow firmly against your incision with both hands. This is called "splinting." Doing this helps protect your incision. It also decreases belly discomfort.  If you are being admitted to the hospital overnight, leave your suitcase in the car. After surgery it may be brought to your room.  In case of increased patient census, it may be necessary for you, the patient, to continue your postoperative care in the Same Day Surgery department.  If you are being discharged the day of surgery, you will not be allowed to drive home. You will need a responsible individual to drive you home and stay with you for 24 hours after surgery.   If you are taking public transportation, you will need to have a responsible individual with you.  Please call the Pre-admissions Testing Dept. at [phone] if you have any questions about these instructions.  Surgery Visitation Policy:  Patients having surgery or a procedure may have two visitors.  Children under the age of 15 must have an adult with them who is not the patient.  Inpatient Visitation:    Visiting hours are 7 a.m. to 8 p.m. Up to four visitors are allowed at one time in a patient room. The visitors may rotate out with other people during the day.  One visitor age 73 or older may stay with the patient overnight and must be in the room by 8 p.m.   Pre-operative 5 CHG Bath Instructions   You can play a key role in reducing the risk of infection after surgery. Your skin needs to be as free of germs as possible. You can reduce the number of germs on your skin by washing with CHG (chlorhexidine gluconate) soap before surgery. CHG is an antiseptic soap that kills germs and continues to kill germs even after washing.   DO NOT use if you have an allergy to chlorhexidine/CHG or antibacterial soaps. If your skin becomes reddened  or irritated, stop using the CHG and notify one of our RNs at [phone].   Please shower with the CHG soap starting 4 days before surgery using the following schedule:     Please keep in mind the following:  DO NOT shave, including legs and underarms, starting the day of your first shower.   You may shave your face at any point before/day of surgery.  Place clean sheets on your bed the day you start using CHG soap. Use a clean washcloth (not used since being washed) for each shower. DO NOT sleep with pets once you start using the CHG.   CHG Shower Instructions:  If you choose to wash your hair and private area, wash first with your normal shampoo/soap.  After you use shampoo/soap, rinse your hair and body thoroughly to remove shampoo/soap residue.  Turn the water  OFF and apply about 3 tablespoons (45 ml) of CHG soap to a CLEAN washcloth.  Apply CHG soap ONLY FROM YOUR NECK DOWN TO YOUR TOES (washing for 3-5 minutes)  DO NOT use CHG soap on face, private areas, open wounds, or sores.  Pay special attention to the area where your surgery is being performed.  If you are having back surgery, having someone wash your back for you may be helpful. Wait 2 minutes after CHG soap is applied, then you may rinse off the CHG soap.  Pat dry with a clean towel  Put on clean clothes/pajamas   If you choose to wear lotion, please use ONLY the CHG-compatible lotions on the back of this paper.     Additional instructions for the day of surgery: DO NOT APPLY any lotions, deodorants, cologne, or perfumes.   Put on clean/comfortable clothes.  Brush your teeth.  Ask your nurse before applying any prescription medications to the skin.      CHG Compatible Lotions   Aveeno Moisturizing lotion  Cetaphil Moisturizing Cream  Cetaphil Moisturizing Lotion  Clairol Herbal Essence Moisturizing Lotion, Dry Skin  Clairol Herbal Essence Moisturizing Lotion, Extra Dry Skin  Clairol Herbal Essence  Moisturizing Lotion, Normal Skin  Curel Age Defying Therapeutic Moisturizing Lotion with Alpha Hydroxy  Curel Extreme Care Body Lotion  Curel Soothing Hands Moisturizing Hand Lotion  Curel Therapeutic Moisturizing Cream, Fragrance-Free  Curel Therapeutic Moisturizing Lotion, Fragrance-Free  Curel Therapeutic Moisturizing Lotion, Original Formula  Eucerin Daily Replenishing Lotion  Eucerin Dry Skin Therapy Plus Alpha Hydroxy Crme  Eucerin Dry Skin Therapy Plus Alpha Hydroxy Lotion  Eucerin Original Crme  Eucerin Original Lotion  Eucerin Plus Crme Eucerin Plus Lotion  Eucerin TriLipid Replenishing Lotion  Keri Anti-Bacterial Hand Lotion  Keri Deep Conditioning Original Lotion Dry Skin Formula Softly Scented  Keri Deep Conditioning Original Lotion, Fragrance Free Sensitive Skin Formula  Keri Lotion Fast Absorbing Fragrance Free Sensitive Skin Formula  Keri Lotion Fast Absorbing Softly Scented Dry Skin Formula  Keri Original Lotion  Keri Skin Renewal Lotion Keri Silky Smooth Lotion  Keri Silky Smooth Sensitive Skin Lotion  Nivea Body Creamy Conditioning Oil  Nivea Body Extra Enriched Lotion  Nivea Body Original Lotion  Nivea Body Sheer Moisturizing Lotion Nivea Crme  Nivea Skin Firming Lotion  NutraDerm 30 Skin Lotion  NutraDerm Skin Lotion  NutraDerm Therapeutic Skin Cream  NutraDerm Therapeutic Skin Lotion  ProShield Protective Hand Cream  Provon moisturizing lotion  How to Use an Incentive Spirometer An incentive spirometer is a tool that measures how well you are filling your lungs with each breath. Learning to take long, deep breaths using this tool can help you keep your lungs clear and active. This may help to reverse or lessen your chance of developing breathing (pulmonary) problems, especially infection. You may be asked to use a spirometer: After a surgery. If you have a lung problem or a history of smoking. After a long period of time when you have been unable to move  or be active. If the spirometer includes an indicator to show the highest number that you have reached, your health care provider or respiratory therapist will help you set a goal. Keep a log of your progress as told by your health care provider. What are the risks? Breathing too quickly may cause dizziness or cause you to pass out. Take your time so you do not get dizzy or light-headed. If you are in pain, you may need to take pain medicine before doing incentive spirometry. It is harder to take a deep breath if you are having pain. How to use your incentive spirometer  Sit up on the edge of your bed  or on a chair. Hold the incentive spirometer so that it is in an upright position. Before you use the spirometer, breathe out normally. Place the mouthpiece in your mouth. Make sure your lips are closed tightly around it. Breathe in slowly and as deeply as you can through your mouth, causing the piston or the ball to rise toward the top of the chamber. Hold your breath for 3-5 seconds, or for as long as possible. If the spirometer includes a coach indicator, use this to guide you in breathing. Slow down your breathing if the indicator goes above the marked areas. Remove the mouthpiece from your mouth and breathe out normally. The piston or ball will return to the bottom of the chamber. Rest for a few seconds, then repeat the steps 10 or more times. Take your time and take a few normal breaths between deep breaths so that you do not get dizzy or light-headed. Do this every 1-2 hours when you are awake. If the spirometer includes a goal marker to show the highest number you have reached (best effort), use this as a goal to work toward during each repetition. After each set of 10 deep breaths, cough a few times. This will help to make sure that your lungs are clear. If you have an incision on your chest or abdomen from surgery, place a pillow or a rolled-up towel firmly against the incision when you  cough. This can help to reduce pain while taking deep breaths and coughing. General tips When you are able to get out of bed: Walk around often. Continue to take deep breaths and cough in order to clear your lungs. Keep using the incentive spirometer until your health care provider says it is okay to stop using it. If you have been in the hospital, you may be told to keep using the spirometer at home. Contact a health care provider if: You are having difficulty using the spirometer. You have trouble using the spirometer as often as instructed. Your pain medicine is not giving enough relief for you to use the spirometer as told. You have a fever. Get help right away if: You develop shortness of breath. You develop a cough with bloody mucus from the lungs. You have fluid or blood coming from an incision site after you cough. Summary An incentive spirometer is a tool that can help you learn to take long, deep breaths to keep your lungs clear and active. You may be asked to use a spirometer after a surgery, if you have a lung problem or a history of smoking, or if you have been inactive for a long period of time. Use your incentive spirometer as instructed every 1-2 hours while you are awake. If you have an incision on your chest or abdomen, place a pillow or a rolled-up towel firmly against your incision when you cough. This will help to reduce pain. Get help right away if you have shortness of breath, you cough up bloody mucus, or blood comes from your incision when you cough. This information is not intended to replace advice given to you by your health care provider. Make sure you discuss any questions you have with your health care provider. Document Revised: 03/10/2023 Document Reviewed: 03/10/2023 Elsevier Patient Education  2024 Elsevier Inc.    Preoperative Educational Videos for Total Hip, Knee and Shoulder Replacements  To better prepare for surgery, please view our videos that  explain the physical activity and discharge planning required to have the best  surgical recovery at [facility].  [email or web address]  Questions? Call [phone] or email [email or web address]        Community Resource Directory to address health-related social needs:  [email or web address]

## 2023-11-26 ENCOUNTER — Ambulatory Visit: Payer: Self-pay | Admitting: Urgent Care

## 2023-11-27 ENCOUNTER — Telehealth: Payer: Self-pay | Admitting: Urgent Care

## 2023-11-27 ENCOUNTER — Encounter: Payer: Self-pay | Admitting: Urgent Care

## 2023-11-27 DIAGNOSIS — Z01812 Encounter for preprocedural laboratory examination: Secondary | ICD-10-CM

## 2023-11-27 DIAGNOSIS — B9620 Unspecified Escherichia coli [E. coli] as the cause of diseases classified elsewhere: Secondary | ICD-10-CM

## 2023-11-27 LAB — URINE CULTURE: Culture: >=100000 — AB

## 2023-11-27 MED ORDER — CEPHALEXIN 500 MG PO CAPS: 500.0000 mg | ORAL_CAPSULE | Freq: Two times a day (BID) | ORAL | 0 refills | Status: AC

## 2023-11-27 NOTE — Progress Notes (Signed)
 [facility] Perioperative Services: Pre-Admission/Anesthesia Testing  Abnormal Lab Notification and Treatment Plan of Care   Date: 11/27/23  Name: Jennifer Shelton DOB: 02/22/1946 MRN:   [id number]  Re: Abnormal labs noted during PAT appointment   Notified:  Provider Name Provider Role Notification Mode  Poggi, Norleen, MD Orthopedics (Surgeon) Routed and/or faxed via [facility]   Abnormal Lab Value(s):   Lab Results  Component Value Date   COLORURINE YELLOW (A) 11/24/2023   APPEARANCEUR CLEAR (A) 11/24/2023   LABSPEC 1.014 11/24/2023   PHURINE 5.0 11/24/2023   GLUCOSEU NEGATIVE 11/24/2023   HGBUR NEGATIVE 11/24/2023   BILIRUBINUR NEGATIVE 11/24/2023   KETONESUR NEGATIVE 11/24/2023   PROTEINUR NEGATIVE 11/24/2023   NITRITE POSITIVE (A) 11/24/2023   LEUKOCYTESUR TRACE (A) 11/24/2023   EPIU 0-5 11/24/2023   WBCU 0-5 11/24/2023   RBCU 0-5 11/24/2023   BACTERIA RARE (A) 11/24/2023   CULT >=100,000 COLONIES/mL ESCHERICHIA COLI (A) 11/24/2023    Clinical Information and Notes:  Patient is scheduled for LEFT TOTAL KNEE ARTHROPLASTY on 12/05/2023.  UA performed in PAT consistent with/concerning for infection.  (+) leukocytosis noted on CBC; WBC 13.8 Renal function: Estimated Creatinine Clearance: 47 mL/min (by C-G formula based on SCr of 0.89 mg/dL). Urine C&S added to assess for pathogenically significant growth.  Impression and Plan:  Jennifer Shelton with a UA that was (+) for infection; reflex culture sent. Contacted patient to discuss. Patient reporting that she is experiencing significant dysuria, suprapubic pain, and cloudy malodorous urine that began yesterday. Patient with surgery scheduled soon. In efforts to avoid delaying patient's procedure, or have her experience any potentially significant perioperative complications related to the aforementioned, I would like to proceed with empiric treatment for urinary tract infection.  Allergies reviewed.  Culture report also reviewed to ensure culture appropriate coverage is being provided. Will treat with a 5 day course of CEPHALEXIN . Cephalexin  dosed 500mg  BID was equally safe and effective as QID dosing in the treatment of female patients with uncomplicated urinary tract infections Arthur et al., 2023). Patient encouraged to complete the entire course of antibiotics even if she begins to feel better.   Meds ordered this encounter  Medications   cephALEXin  (KEFLEX ) 500 MG capsule    Sig: Take 1 capsule (500 mg total) by mouth 2 (two) times daily for 5 days. Increase water  intake while taking this medication.    Dispense:  10 capsule    Refill:  0    Please contact the patient as soon as it is available for pickup. Rx is for preoperative UTI treatment and needs to be started ASAP.   Patient encouraged to increase her fluid intake as much as possible. Discussed that water  is always best to flush the urinary tract. She was advised to avoid caffeine containing fluids until her infections clears, as caffeine can cause her to experience painful bladder spasms.   May use Tylenol  as needed for pain/fever should she experience these symptoms. Again, renal function is normal, therefore may also use over the counter phenazopyridine to help relieve her current urinary pain.   Patient instructed to call surgeon's office or PAT with any questions or concerns related to the above outlined course of treatment. Additionally, she was instructed to call if she feels like she is getting worse overall while on treatment. Results and treatment plan of care forwarded to primary attending surgeon to make them aware.   Encounter Diagnoses  Name Primary?   Pre-operative laboratory examination Yes  E. coli UTI (urinary tract infection)    Citation: Brad DELENA Chang HM, Eid K, Jameson AP, Dumkow LE. Two Times Versus Four Times Daily Cephalexin  Dosing for the Treatment of Uncomplicated Urinary Tract Infections in  Females. Open Forum Infect Dis. 2023 Aug 11;10(9):ofad430. doi: 10.1093/ofid/ofad430. PMID: 62220402; PMCID: [id number].  Dorise Pereyra, MSN, APRN, FNP-C, CEN [facility]  Perioperative Services Nurse Practitioner Phone: [phone] Fax: [phone] 11/27/23 1:15 PM  NOTE: This note has been prepared using Dragon dictation software. Despite my best ability to proofread, there is always the potential that unintentional transcriptional errors may still occur from this process.

## 2023-12-05 ENCOUNTER — Ambulatory Visit: Admission: RE | Admit: 2023-12-05 | Source: Home / Self Care | Admitting: Surgery

## 2023-12-05 ENCOUNTER — Encounter: Admission: RE | Payer: Self-pay | Source: Home / Self Care

## 2023-12-05 ENCOUNTER — Encounter: Payer: Self-pay | Admitting: Urgent Care

## 2023-12-05 SURGERY — ARTHROPLASTY, KNEE, TOTAL
Anesthesia: Choice | Site: Knee | Laterality: Left

## 2024-03-12 ENCOUNTER — Other Ambulatory Visit: Payer: Self-pay | Admitting: Internal Medicine

## 2024-03-12 DIAGNOSIS — E1169 Type 2 diabetes mellitus with other specified complication: Secondary | ICD-10-CM

## 2024-03-12 DIAGNOSIS — R911 Solitary pulmonary nodule: Secondary | ICD-10-CM

## 2024-03-22 ENCOUNTER — Ambulatory Visit
Admission: RE | Admit: 2024-03-22 | Discharge: 2024-03-22 | Disposition: A | Discharge status: Home / Self Care | Source: Ambulatory Visit | Attending: Internal Medicine | Admitting: Internal Medicine

## 2024-03-22 DIAGNOSIS — E785 Hyperlipidemia, unspecified: Secondary | ICD-10-CM | POA: Diagnosis present

## 2024-03-22 DIAGNOSIS — R911 Solitary pulmonary nodule: Secondary | ICD-10-CM | POA: Insufficient documentation

## 2024-03-22 DIAGNOSIS — E1169 Type 2 diabetes mellitus with other specified complication: Secondary | ICD-10-CM | POA: Insufficient documentation
